# Patient Record
Sex: Female | Born: 1975
Health system: Southern US, Community
[De-identification: ages and names within clinical notes are randomized; demographics above are authoritative.]

## PROBLEM LIST (undated history)

## (undated) DIAGNOSIS — J189 Pneumonia, unspecified organism: Secondary | ICD-10-CM

## (undated) DIAGNOSIS — R112 Nausea with vomiting, unspecified: Secondary | ICD-10-CM

## (undated) DIAGNOSIS — M654 Radial styloid tenosynovitis [de Quervain]: Secondary | ICD-10-CM

## (undated) DIAGNOSIS — M1990 Unspecified osteoarthritis, unspecified site: Secondary | ICD-10-CM

## (undated) DIAGNOSIS — F419 Anxiety disorder, unspecified: Secondary | ICD-10-CM

## (undated) DIAGNOSIS — Z87898 Personal history of other specified conditions: Secondary | ICD-10-CM

## (undated) DIAGNOSIS — Z972 Presence of dental prosthetic device (complete) (partial): Secondary | ICD-10-CM

## (undated) DIAGNOSIS — Z9889 Other specified postprocedural states: Secondary | ICD-10-CM

## (undated) DIAGNOSIS — Z8489 Family history of other specified conditions: Secondary | ICD-10-CM

## (undated) DIAGNOSIS — T7840XA Allergy, unspecified, initial encounter: Secondary | ICD-10-CM

## (undated) DIAGNOSIS — G5601 Carpal tunnel syndrome, right upper limb: Secondary | ICD-10-CM

## (undated) DIAGNOSIS — M65312 Trigger thumb, left thumb: Secondary | ICD-10-CM

## (undated) DIAGNOSIS — G459 Transient cerebral ischemic attack, unspecified: Secondary | ICD-10-CM

## (undated) DIAGNOSIS — Z87442 Personal history of urinary calculi: Secondary | ICD-10-CM

## (undated) DIAGNOSIS — C801 Malignant (primary) neoplasm, unspecified: Secondary | ICD-10-CM

## (undated) HISTORY — DX: Allergy, unspecified, initial encounter: T78.40XA

## (undated) HISTORY — DX: Unspecified osteoarthritis, unspecified site: M19.90

## (undated) HISTORY — DX: Anxiety disorder, unspecified: F41.9

## (undated) HISTORY — PX: ROTATOR CUFF REPAIR: SHX139

## (undated) HISTORY — PX: FACIAL RECONSTRUCTION SURGERY: SHX631

---

## 1998-01-11 ENCOUNTER — Encounter: Admission: RE | Admit: 1998-01-11 | Discharge: 1998-01-11 | Payer: Self-pay | Admitting: Family Medicine

## 1998-02-20 ENCOUNTER — Encounter: Admission: RE | Admit: 1998-02-20 | Discharge: 1998-02-20 | Payer: Self-pay | Admitting: Family Medicine

## 1998-02-27 ENCOUNTER — Encounter: Admission: RE | Admit: 1998-02-27 | Discharge: 1998-05-28 | Payer: Self-pay | Admitting: *Deleted

## 1998-02-27 ENCOUNTER — Encounter: Admission: RE | Admit: 1998-02-27 | Discharge: 1998-02-27 | Payer: Self-pay | Admitting: Family Medicine

## 1998-02-28 ENCOUNTER — Encounter: Admission: RE | Admit: 1998-02-28 | Discharge: 1998-02-28 | Payer: Self-pay | Admitting: Sports Medicine

## 1998-04-11 ENCOUNTER — Encounter: Admission: RE | Admit: 1998-04-11 | Discharge: 1998-04-11 | Payer: Self-pay | Admitting: Family Medicine

## 1998-04-17 ENCOUNTER — Ambulatory Visit (HOSPITAL_BASED_OUTPATIENT_CLINIC_OR_DEPARTMENT_OTHER): Admission: RE | Admit: 1998-04-17 | Discharge: 1998-04-17 | Payer: Self-pay | Admitting: Orthopedic Surgery

## 1998-04-20 ENCOUNTER — Encounter: Admission: RE | Admit: 1998-04-20 | Discharge: 1998-04-20 | Payer: Self-pay | Admitting: Family Medicine

## 1998-05-03 ENCOUNTER — Encounter: Admission: RE | Admit: 1998-05-03 | Discharge: 1998-05-03 | Payer: Self-pay | Admitting: Family Medicine

## 1998-05-05 ENCOUNTER — Encounter: Admission: RE | Admit: 1998-05-05 | Discharge: 1998-05-05 | Payer: Self-pay | Admitting: Family Medicine

## 1998-05-16 ENCOUNTER — Encounter: Admission: RE | Admit: 1998-05-16 | Discharge: 1998-08-14 | Payer: Self-pay | Admitting: Orthopedic Surgery

## 1998-06-19 ENCOUNTER — Encounter: Admission: RE | Admit: 1998-06-19 | Discharge: 1998-06-19 | Payer: Self-pay | Admitting: Family Medicine

## 1998-07-10 ENCOUNTER — Encounter: Admission: RE | Admit: 1998-07-10 | Discharge: 1998-07-10 | Payer: Self-pay | Admitting: Family Medicine

## 1998-07-28 ENCOUNTER — Other Ambulatory Visit: Admission: RE | Admit: 1998-07-28 | Discharge: 1998-07-28 | Payer: Self-pay | Admitting: *Deleted

## 1998-07-28 ENCOUNTER — Encounter: Admission: RE | Admit: 1998-07-28 | Discharge: 1998-07-28 | Payer: Self-pay | Admitting: Family Medicine

## 1998-08-04 ENCOUNTER — Encounter: Admission: RE | Admit: 1998-08-04 | Discharge: 1998-08-04 | Payer: Self-pay | Admitting: Family Medicine

## 1998-08-23 ENCOUNTER — Encounter: Admission: RE | Admit: 1998-08-23 | Discharge: 1998-08-23 | Payer: Self-pay | Admitting: Family Medicine

## 1998-09-08 ENCOUNTER — Encounter: Admission: RE | Admit: 1998-09-08 | Discharge: 1998-09-08 | Payer: Self-pay | Admitting: Family Medicine

## 1998-09-21 ENCOUNTER — Encounter: Admission: RE | Admit: 1998-09-21 | Discharge: 1998-09-21 | Payer: Self-pay | Admitting: Family Medicine

## 1998-11-09 ENCOUNTER — Encounter: Admission: RE | Admit: 1998-11-09 | Discharge: 1998-11-09 | Payer: Self-pay | Admitting: Family Medicine

## 1998-11-16 ENCOUNTER — Encounter: Admission: RE | Admit: 1998-11-16 | Discharge: 1998-11-16 | Payer: Self-pay | Admitting: Family Medicine

## 1998-12-28 ENCOUNTER — Encounter: Admission: RE | Admit: 1998-12-28 | Discharge: 1998-12-28 | Payer: Self-pay | Admitting: Family Medicine

## 1999-01-11 ENCOUNTER — Encounter: Admission: RE | Admit: 1999-01-11 | Discharge: 1999-01-11 | Payer: Self-pay | Admitting: Family Medicine

## 1999-01-17 ENCOUNTER — Encounter: Admission: RE | Admit: 1999-01-17 | Discharge: 1999-01-17 | Payer: Self-pay | Admitting: Family Medicine

## 1999-01-18 ENCOUNTER — Encounter: Admission: RE | Admit: 1999-01-18 | Discharge: 1999-01-18 | Payer: Self-pay | Admitting: Family Medicine

## 1999-01-31 ENCOUNTER — Encounter: Admission: RE | Admit: 1999-01-31 | Discharge: 1999-01-31 | Payer: Self-pay | Admitting: Family Medicine

## 1999-02-14 ENCOUNTER — Encounter: Admission: RE | Admit: 1999-02-14 | Discharge: 1999-02-14 | Payer: Self-pay | Admitting: Family Medicine

## 1999-04-13 ENCOUNTER — Encounter: Admission: RE | Admit: 1999-04-13 | Discharge: 1999-04-13 | Payer: Self-pay | Admitting: Family Medicine

## 1999-04-18 ENCOUNTER — Encounter: Admission: RE | Admit: 1999-04-18 | Discharge: 1999-04-18 | Payer: Self-pay | Admitting: Family Medicine

## 1999-05-23 ENCOUNTER — Encounter: Admission: RE | Admit: 1999-05-23 | Discharge: 1999-05-23 | Payer: Self-pay | Admitting: Family Medicine

## 1999-05-31 ENCOUNTER — Encounter: Admission: RE | Admit: 1999-05-31 | Discharge: 1999-05-31 | Payer: Self-pay | Admitting: Family Medicine

## 1999-06-15 ENCOUNTER — Encounter: Admission: RE | Admit: 1999-06-15 | Discharge: 1999-06-15 | Payer: Self-pay | Admitting: Family Medicine

## 1999-06-27 ENCOUNTER — Encounter: Admission: RE | Admit: 1999-06-27 | Discharge: 1999-06-27 | Payer: Self-pay | Admitting: Family Medicine

## 1999-07-31 ENCOUNTER — Other Ambulatory Visit: Admission: RE | Admit: 1999-07-31 | Discharge: 2001-07-30 | Payer: Self-pay | Admitting: *Deleted

## 1999-07-31 ENCOUNTER — Encounter: Admission: RE | Admit: 1999-07-31 | Discharge: 1999-07-31 | Payer: Self-pay | Admitting: Sports Medicine

## 1999-10-17 ENCOUNTER — Encounter: Admission: RE | Admit: 1999-10-17 | Discharge: 1999-10-17 | Payer: Self-pay | Admitting: Family Medicine

## 1999-12-15 ENCOUNTER — Emergency Department (HOSPITAL_COMMUNITY): Admission: EM | Admit: 1999-12-15 | Discharge: 1999-12-15 | Payer: Self-pay | Admitting: Emergency Medicine

## 1999-12-17 ENCOUNTER — Emergency Department (HOSPITAL_COMMUNITY): Admission: EM | Admit: 1999-12-17 | Discharge: 1999-12-17 | Payer: Self-pay | Admitting: Emergency Medicine

## 1999-12-18 ENCOUNTER — Encounter: Admission: RE | Admit: 1999-12-18 | Discharge: 1999-12-18 | Payer: Self-pay | Admitting: Family Medicine

## 1999-12-19 ENCOUNTER — Encounter: Admission: RE | Admit: 1999-12-19 | Discharge: 1999-12-19 | Payer: Self-pay | Admitting: Family Medicine

## 2000-05-09 ENCOUNTER — Encounter: Admission: RE | Admit: 2000-05-09 | Discharge: 2000-05-09 | Payer: Self-pay | Admitting: Family Medicine

## 2000-06-26 ENCOUNTER — Encounter: Admission: RE | Admit: 2000-06-26 | Discharge: 2000-06-26 | Payer: Self-pay | Admitting: Family Medicine

## 2000-07-03 ENCOUNTER — Encounter: Admission: RE | Admit: 2000-07-03 | Discharge: 2000-07-03 | Payer: Self-pay | Admitting: Family Medicine

## 2000-07-11 ENCOUNTER — Encounter: Admission: RE | Admit: 2000-07-11 | Discharge: 2000-07-11 | Payer: Self-pay | Admitting: Family Medicine

## 2000-07-11 ENCOUNTER — Other Ambulatory Visit: Admission: RE | Admit: 2000-07-11 | Discharge: 2000-07-11 | Payer: Self-pay | Admitting: Obstetrics & Gynecology

## 2000-08-11 ENCOUNTER — Encounter: Admission: RE | Admit: 2000-08-11 | Discharge: 2000-08-11 | Payer: Self-pay | Admitting: Family Medicine

## 2000-08-25 ENCOUNTER — Encounter: Admission: RE | Admit: 2000-08-25 | Discharge: 2000-08-25 | Payer: Self-pay | Admitting: Sports Medicine

## 2000-09-23 ENCOUNTER — Encounter: Admission: RE | Admit: 2000-09-23 | Discharge: 2000-09-23 | Payer: Self-pay | Admitting: Family Medicine

## 2000-10-13 ENCOUNTER — Encounter: Admission: RE | Admit: 2000-10-13 | Discharge: 2000-10-13 | Payer: Self-pay | Admitting: Family Medicine

## 2000-10-29 ENCOUNTER — Encounter: Payer: Self-pay | Admitting: Neurology

## 2000-10-29 ENCOUNTER — Ambulatory Visit (HOSPITAL_COMMUNITY): Admission: RE | Admit: 2000-10-29 | Discharge: 2000-10-29 | Payer: Self-pay | Admitting: Neurology

## 2001-01-12 ENCOUNTER — Encounter: Admission: RE | Admit: 2001-01-12 | Discharge: 2001-01-12 | Payer: Self-pay | Admitting: Family Medicine

## 2001-01-13 ENCOUNTER — Emergency Department (HOSPITAL_COMMUNITY): Admission: EM | Admit: 2001-01-13 | Discharge: 2001-01-13 | Payer: Self-pay | Admitting: Emergency Medicine

## 2001-01-13 ENCOUNTER — Encounter: Payer: Self-pay | Admitting: Emergency Medicine

## 2001-03-13 ENCOUNTER — Encounter: Admission: RE | Admit: 2001-03-13 | Discharge: 2001-03-13 | Payer: Self-pay | Admitting: Family Medicine

## 2001-03-13 ENCOUNTER — Encounter: Admission: RE | Admit: 2001-03-13 | Discharge: 2001-03-13 | Payer: Self-pay | Admitting: *Deleted

## 2001-03-18 ENCOUNTER — Encounter: Admission: RE | Admit: 2001-03-18 | Discharge: 2001-03-18 | Payer: Self-pay | Admitting: Family Medicine

## 2001-05-27 ENCOUNTER — Emergency Department (HOSPITAL_COMMUNITY): Admission: EM | Admit: 2001-05-27 | Discharge: 2001-05-28 | Payer: Self-pay | Admitting: Emergency Medicine

## 2001-06-09 ENCOUNTER — Encounter: Admission: RE | Admit: 2001-06-09 | Discharge: 2001-06-09 | Payer: Self-pay | Admitting: Sports Medicine

## 2001-08-25 ENCOUNTER — Encounter: Admission: RE | Admit: 2001-08-25 | Discharge: 2001-08-25 | Payer: Self-pay | Admitting: Family Medicine

## 2001-09-24 ENCOUNTER — Other Ambulatory Visit: Admission: RE | Admit: 2001-09-24 | Discharge: 2001-09-24 | Payer: Self-pay | Admitting: Sports Medicine

## 2001-09-24 ENCOUNTER — Encounter: Admission: RE | Admit: 2001-09-24 | Discharge: 2001-09-24 | Payer: Self-pay | Admitting: Family Medicine

## 2001-10-19 ENCOUNTER — Encounter: Admission: RE | Admit: 2001-10-19 | Discharge: 2001-10-19 | Payer: Self-pay | Admitting: Sports Medicine

## 2001-10-26 ENCOUNTER — Encounter: Admission: RE | Admit: 2001-10-26 | Discharge: 2001-10-26 | Payer: Self-pay | Admitting: Family Medicine

## 2001-11-11 ENCOUNTER — Encounter: Admission: RE | Admit: 2001-11-11 | Discharge: 2001-11-11 | Payer: Self-pay | Admitting: Family Medicine

## 2001-12-10 ENCOUNTER — Encounter: Admission: RE | Admit: 2001-12-10 | Discharge: 2001-12-10 | Payer: Self-pay | Admitting: Family Medicine

## 2001-12-16 ENCOUNTER — Encounter: Admission: RE | Admit: 2001-12-16 | Discharge: 2001-12-16 | Payer: Self-pay | Admitting: Psychology

## 2001-12-24 ENCOUNTER — Encounter: Admission: RE | Admit: 2001-12-24 | Discharge: 2001-12-24 | Payer: Self-pay | Admitting: Family Medicine

## 2002-01-13 ENCOUNTER — Encounter: Admission: RE | Admit: 2002-01-13 | Discharge: 2002-01-13 | Payer: Self-pay | Admitting: Family Medicine

## 2002-02-03 ENCOUNTER — Emergency Department (HOSPITAL_COMMUNITY): Admission: EM | Admit: 2002-02-03 | Discharge: 2002-02-04 | Payer: Self-pay | Admitting: Emergency Medicine

## 2002-02-03 ENCOUNTER — Encounter: Admission: RE | Admit: 2002-02-03 | Discharge: 2002-02-03 | Payer: Self-pay | Admitting: Family Medicine

## 2002-02-08 ENCOUNTER — Inpatient Hospital Stay (HOSPITAL_COMMUNITY): Admission: AD | Admit: 2002-02-08 | Discharge: 2002-02-09 | Payer: Self-pay | Admitting: Family Medicine

## 2002-02-08 ENCOUNTER — Encounter: Admission: RE | Admit: 2002-02-08 | Discharge: 2002-02-08 | Payer: Self-pay | Admitting: Sports Medicine

## 2002-02-08 ENCOUNTER — Encounter: Payer: Self-pay | Admitting: Family Medicine

## 2002-02-15 ENCOUNTER — Encounter: Payer: Self-pay | Admitting: Sports Medicine

## 2002-02-15 ENCOUNTER — Encounter: Admission: RE | Admit: 2002-02-15 | Discharge: 2002-02-15 | Payer: Self-pay | Admitting: Family Medicine

## 2002-02-15 ENCOUNTER — Encounter: Admission: RE | Admit: 2002-02-15 | Discharge: 2002-02-15 | Payer: Self-pay | Admitting: Sports Medicine

## 2002-02-19 ENCOUNTER — Encounter: Admission: RE | Admit: 2002-02-19 | Discharge: 2002-02-19 | Payer: Self-pay | Admitting: Family Medicine

## 2002-02-25 ENCOUNTER — Encounter: Admission: RE | Admit: 2002-02-25 | Discharge: 2002-02-25 | Payer: Self-pay | Admitting: Family Medicine

## 2002-03-02 ENCOUNTER — Encounter: Admission: RE | Admit: 2002-03-02 | Discharge: 2002-03-02 | Payer: Self-pay | Admitting: Family Medicine

## 2002-04-14 ENCOUNTER — Encounter: Admission: RE | Admit: 2002-04-14 | Discharge: 2002-04-14 | Payer: Self-pay | Admitting: Sports Medicine

## 2002-05-14 ENCOUNTER — Encounter: Admission: RE | Admit: 2002-05-14 | Discharge: 2002-05-14 | Payer: Self-pay | Admitting: Podiatry

## 2002-07-08 ENCOUNTER — Encounter: Admission: RE | Admit: 2002-07-08 | Discharge: 2002-07-08 | Payer: Self-pay | Admitting: Family Medicine

## 2002-09-24 ENCOUNTER — Encounter: Admission: RE | Admit: 2002-09-24 | Discharge: 2002-09-24 | Payer: Self-pay | Admitting: Family Medicine

## 2002-09-24 ENCOUNTER — Other Ambulatory Visit: Admission: RE | Admit: 2002-09-24 | Discharge: 2002-09-24 | Payer: Self-pay | Admitting: Family Medicine

## 2002-12-12 ENCOUNTER — Emergency Department (HOSPITAL_COMMUNITY): Admission: EM | Admit: 2002-12-12 | Discharge: 2002-12-12 | Payer: Self-pay | Admitting: Emergency Medicine

## 2002-12-12 ENCOUNTER — Encounter: Payer: Self-pay | Admitting: Emergency Medicine

## 2003-01-05 ENCOUNTER — Encounter: Payer: Self-pay | Admitting: Orthopedic Surgery

## 2003-01-05 ENCOUNTER — Ambulatory Visit (HOSPITAL_COMMUNITY): Admission: RE | Admit: 2003-01-05 | Discharge: 2003-01-05 | Payer: Self-pay | Admitting: Orthopedic Surgery

## 2003-01-26 ENCOUNTER — Encounter: Admission: RE | Admit: 2003-01-26 | Discharge: 2003-02-25 | Payer: Self-pay | Admitting: Orthopedic Surgery

## 2003-04-13 ENCOUNTER — Emergency Department (HOSPITAL_COMMUNITY): Admission: EM | Admit: 2003-04-13 | Discharge: 2003-04-13 | Payer: Self-pay | Admitting: Emergency Medicine

## 2003-04-14 ENCOUNTER — Encounter: Admission: RE | Admit: 2003-04-14 | Discharge: 2003-04-14 | Payer: Self-pay | Admitting: Family Medicine

## 2003-04-19 ENCOUNTER — Encounter: Admission: RE | Admit: 2003-04-19 | Discharge: 2003-04-19 | Payer: Self-pay | Admitting: Family Medicine

## 2003-05-03 ENCOUNTER — Encounter: Admission: RE | Admit: 2003-05-03 | Discharge: 2003-05-03 | Payer: Self-pay | Admitting: Sports Medicine

## 2003-05-06 ENCOUNTER — Encounter: Payer: Self-pay | Admitting: Family Medicine

## 2003-05-06 ENCOUNTER — Ambulatory Visit (HOSPITAL_COMMUNITY): Admission: RE | Admit: 2003-05-06 | Discharge: 2003-05-06 | Payer: Self-pay | Admitting: Family Medicine

## 2003-05-31 ENCOUNTER — Encounter: Admission: RE | Admit: 2003-05-31 | Discharge: 2003-05-31 | Payer: Self-pay | Admitting: Sports Medicine

## 2003-06-14 ENCOUNTER — Encounter: Admission: RE | Admit: 2003-06-14 | Discharge: 2003-06-14 | Payer: Self-pay | Admitting: Family Medicine

## 2003-06-14 ENCOUNTER — Other Ambulatory Visit: Admission: RE | Admit: 2003-06-14 | Discharge: 2003-06-14 | Payer: Self-pay | Admitting: Family Medicine

## 2003-07-04 ENCOUNTER — Encounter: Admission: RE | Admit: 2003-07-04 | Discharge: 2003-07-04 | Payer: Self-pay | Admitting: Family Medicine

## 2003-07-08 ENCOUNTER — Ambulatory Visit (HOSPITAL_COMMUNITY): Admission: RE | Admit: 2003-07-08 | Discharge: 2003-07-08 | Payer: Self-pay | Admitting: Family Medicine

## 2003-07-11 ENCOUNTER — Inpatient Hospital Stay (HOSPITAL_COMMUNITY): Admission: AD | Admit: 2003-07-11 | Discharge: 2003-07-11 | Payer: Self-pay | Admitting: Obstetrics & Gynecology

## 2003-08-01 ENCOUNTER — Encounter: Admission: RE | Admit: 2003-08-01 | Discharge: 2003-08-01 | Payer: Self-pay | Admitting: Family Medicine

## 2003-08-18 ENCOUNTER — Encounter: Admission: RE | Admit: 2003-08-18 | Discharge: 2003-08-18 | Payer: Self-pay | Admitting: Sports Medicine

## 2003-08-25 ENCOUNTER — Encounter: Admission: RE | Admit: 2003-08-25 | Discharge: 2003-08-25 | Payer: Self-pay | Admitting: Family Medicine

## 2003-08-29 ENCOUNTER — Encounter: Admission: RE | Admit: 2003-08-29 | Discharge: 2003-08-29 | Payer: Self-pay | Admitting: Family Medicine

## 2003-09-26 ENCOUNTER — Encounter: Admission: RE | Admit: 2003-09-26 | Discharge: 2003-09-26 | Payer: Self-pay | Admitting: Family Medicine

## 2003-10-04 ENCOUNTER — Inpatient Hospital Stay (HOSPITAL_COMMUNITY): Admission: AD | Admit: 2003-10-04 | Discharge: 2003-10-04 | Payer: Self-pay | Admitting: *Deleted

## 2003-10-10 ENCOUNTER — Encounter: Admission: RE | Admit: 2003-10-10 | Discharge: 2003-10-10 | Payer: Self-pay | Admitting: Family Medicine

## 2003-10-25 ENCOUNTER — Encounter: Admission: RE | Admit: 2003-10-25 | Discharge: 2003-10-25 | Payer: Self-pay | Admitting: Family Medicine

## 2003-11-09 ENCOUNTER — Encounter: Admission: RE | Admit: 2003-11-09 | Discharge: 2003-11-09 | Payer: Self-pay | Admitting: Family Medicine

## 2003-11-25 ENCOUNTER — Encounter: Admission: RE | Admit: 2003-11-25 | Discharge: 2003-11-25 | Payer: Self-pay | Admitting: Family Medicine

## 2003-11-29 ENCOUNTER — Encounter: Admission: RE | Admit: 2003-11-29 | Discharge: 2003-11-29 | Payer: Self-pay | Admitting: Family Medicine

## 2003-12-06 ENCOUNTER — Inpatient Hospital Stay (HOSPITAL_COMMUNITY): Admission: AD | Admit: 2003-12-06 | Discharge: 2003-12-09 | Payer: Self-pay | Admitting: *Deleted

## 2003-12-07 ENCOUNTER — Encounter (INDEPENDENT_AMBULATORY_CARE_PROVIDER_SITE_OTHER): Payer: Self-pay | Admitting: *Deleted

## 2003-12-07 HISTORY — PX: TUBAL LIGATION: SHX77

## 2003-12-12 ENCOUNTER — Encounter: Admission: RE | Admit: 2003-12-12 | Discharge: 2003-12-12 | Payer: Self-pay | Admitting: Family Medicine

## 2004-01-27 ENCOUNTER — Encounter: Admission: RE | Admit: 2004-01-27 | Discharge: 2004-01-27 | Payer: Self-pay | Admitting: Family Medicine

## 2004-02-24 ENCOUNTER — Encounter: Admission: RE | Admit: 2004-02-24 | Discharge: 2004-02-24 | Payer: Self-pay | Admitting: Sports Medicine

## 2004-06-12 ENCOUNTER — Ambulatory Visit: Payer: Self-pay | Admitting: Family Medicine

## 2004-08-28 ENCOUNTER — Ambulatory Visit: Payer: Self-pay | Admitting: Family Medicine

## 2005-01-28 ENCOUNTER — Ambulatory Visit: Payer: Self-pay | Admitting: Family Medicine

## 2005-01-29 ENCOUNTER — Emergency Department (HOSPITAL_COMMUNITY): Admission: EM | Admit: 2005-01-29 | Discharge: 2005-01-29 | Payer: Self-pay | Admitting: Emergency Medicine

## 2005-01-29 ENCOUNTER — Ambulatory Visit: Payer: Self-pay | Admitting: Sports Medicine

## 2005-03-22 ENCOUNTER — Emergency Department (HOSPITAL_COMMUNITY): Admission: EM | Admit: 2005-03-22 | Discharge: 2005-03-22 | Payer: Self-pay | Admitting: Family Medicine

## 2005-03-27 ENCOUNTER — Ambulatory Visit: Payer: Self-pay | Admitting: Family Medicine

## 2005-04-10 ENCOUNTER — Ambulatory Visit: Payer: Self-pay | Admitting: Sports Medicine

## 2005-04-25 ENCOUNTER — Encounter: Admission: RE | Admit: 2005-04-25 | Discharge: 2005-07-22 | Payer: Self-pay | Admitting: Sports Medicine

## 2005-05-10 ENCOUNTER — Ambulatory Visit: Payer: Self-pay | Admitting: Sports Medicine

## 2005-05-15 ENCOUNTER — Encounter: Admission: RE | Admit: 2005-05-15 | Discharge: 2005-05-15 | Payer: Self-pay | Admitting: Sports Medicine

## 2005-06-04 ENCOUNTER — Ambulatory Visit: Payer: Self-pay | Admitting: Sports Medicine

## 2005-06-05 ENCOUNTER — Ambulatory Visit: Payer: Self-pay | Admitting: Sports Medicine

## 2005-06-19 ENCOUNTER — Ambulatory Visit: Payer: Self-pay | Admitting: Family Medicine

## 2005-07-03 ENCOUNTER — Ambulatory Visit: Payer: Self-pay | Admitting: Sports Medicine

## 2005-07-17 ENCOUNTER — Ambulatory Visit: Payer: Self-pay | Admitting: Sports Medicine

## 2005-08-07 ENCOUNTER — Encounter (INDEPENDENT_AMBULATORY_CARE_PROVIDER_SITE_OTHER): Payer: Self-pay | Admitting: *Deleted

## 2005-08-22 ENCOUNTER — Ambulatory Visit: Payer: Self-pay | Admitting: Sports Medicine

## 2005-08-22 ENCOUNTER — Other Ambulatory Visit: Admission: RE | Admit: 2005-08-22 | Discharge: 2005-08-22 | Payer: Self-pay | Admitting: Family Medicine

## 2005-09-02 ENCOUNTER — Ambulatory Visit: Payer: Self-pay | Admitting: Family Medicine

## 2005-09-10 ENCOUNTER — Ambulatory Visit (HOSPITAL_COMMUNITY): Admission: RE | Admit: 2005-09-10 | Discharge: 2005-09-10 | Payer: Self-pay | Admitting: Neurosurgery

## 2005-09-10 HISTORY — PX: ULNAR NERVE REPAIR: SHX2594

## 2006-09-02 ENCOUNTER — Ambulatory Visit: Payer: Self-pay | Admitting: Family Medicine

## 2006-09-17 ENCOUNTER — Ambulatory Visit: Payer: Self-pay | Admitting: Sports Medicine

## 2006-09-22 ENCOUNTER — Ambulatory Visit: Payer: Self-pay | Admitting: Sports Medicine

## 2006-10-07 ENCOUNTER — Encounter (INDEPENDENT_AMBULATORY_CARE_PROVIDER_SITE_OTHER): Payer: Self-pay | Admitting: Specialist

## 2006-10-07 ENCOUNTER — Inpatient Hospital Stay (HOSPITAL_COMMUNITY): Admission: EM | Admit: 2006-10-07 | Discharge: 2006-10-08 | Payer: Self-pay | Admitting: Family Medicine

## 2006-10-07 HISTORY — PX: APPENDECTOMY: SHX54

## 2006-12-02 ENCOUNTER — Ambulatory Visit: Payer: Self-pay | Admitting: Family Medicine

## 2006-12-04 DIAGNOSIS — Z87898 Personal history of other specified conditions: Secondary | ICD-10-CM | POA: Insufficient documentation

## 2006-12-04 DIAGNOSIS — J45901 Unspecified asthma with (acute) exacerbation: Secondary | ICD-10-CM | POA: Insufficient documentation

## 2006-12-04 DIAGNOSIS — F419 Anxiety disorder, unspecified: Secondary | ICD-10-CM | POA: Insufficient documentation

## 2006-12-04 DIAGNOSIS — J45909 Unspecified asthma, uncomplicated: Secondary | ICD-10-CM | POA: Insufficient documentation

## 2006-12-04 HISTORY — DX: Unspecified asthma with (acute) exacerbation: J45.901

## 2006-12-05 ENCOUNTER — Encounter (INDEPENDENT_AMBULATORY_CARE_PROVIDER_SITE_OTHER): Payer: Self-pay | Admitting: *Deleted

## 2007-02-04 ENCOUNTER — Telehealth: Payer: Self-pay | Admitting: *Deleted

## 2007-08-13 ENCOUNTER — Encounter (INDEPENDENT_AMBULATORY_CARE_PROVIDER_SITE_OTHER): Payer: Self-pay | Admitting: *Deleted

## 2007-10-08 DIAGNOSIS — G459 Transient cerebral ischemic attack, unspecified: Secondary | ICD-10-CM

## 2007-10-08 HISTORY — DX: Transient cerebral ischemic attack, unspecified: G45.9

## 2007-10-30 ENCOUNTER — Telehealth (INDEPENDENT_AMBULATORY_CARE_PROVIDER_SITE_OTHER): Payer: Self-pay | Admitting: *Deleted

## 2007-12-03 ENCOUNTER — Telehealth (INDEPENDENT_AMBULATORY_CARE_PROVIDER_SITE_OTHER): Payer: Self-pay | Admitting: Family Medicine

## 2008-01-07 ENCOUNTER — Telehealth (INDEPENDENT_AMBULATORY_CARE_PROVIDER_SITE_OTHER): Payer: Self-pay | Admitting: *Deleted

## 2008-09-26 ENCOUNTER — Telehealth: Payer: Self-pay | Admitting: *Deleted

## 2008-09-26 ENCOUNTER — Ambulatory Visit: Payer: Self-pay | Admitting: Family Medicine

## 2008-09-27 ENCOUNTER — Telehealth: Payer: Self-pay | Admitting: Family Medicine

## 2009-02-13 ENCOUNTER — Ambulatory Visit: Payer: Self-pay | Admitting: Family Medicine

## 2009-02-13 ENCOUNTER — Inpatient Hospital Stay (HOSPITAL_COMMUNITY): Admission: AD | Admit: 2009-02-13 | Discharge: 2009-02-14 | Payer: Self-pay | Admitting: Family Medicine

## 2009-02-13 DIAGNOSIS — R209 Unspecified disturbances of skin sensation: Secondary | ICD-10-CM | POA: Insufficient documentation

## 2009-02-14 ENCOUNTER — Encounter: Payer: Self-pay | Admitting: Family Medicine

## 2009-02-16 ENCOUNTER — Encounter (INDEPENDENT_AMBULATORY_CARE_PROVIDER_SITE_OTHER): Payer: Self-pay | Admitting: Family Medicine

## 2009-02-16 ENCOUNTER — Ambulatory Visit: Payer: Self-pay | Admitting: Family Medicine

## 2009-02-27 ENCOUNTER — Encounter (INDEPENDENT_AMBULATORY_CARE_PROVIDER_SITE_OTHER): Payer: Self-pay | Admitting: Family Medicine

## 2009-03-13 ENCOUNTER — Telehealth (INDEPENDENT_AMBULATORY_CARE_PROVIDER_SITE_OTHER): Payer: Self-pay | Admitting: Family Medicine

## 2009-11-06 ENCOUNTER — Telehealth: Payer: Self-pay | Admitting: Family Medicine

## 2009-11-08 ENCOUNTER — Encounter (INDEPENDENT_AMBULATORY_CARE_PROVIDER_SITE_OTHER): Payer: Self-pay | Admitting: *Deleted

## 2009-12-18 ENCOUNTER — Telehealth: Payer: Self-pay | Admitting: Family Medicine

## 2010-02-01 ENCOUNTER — Encounter: Admission: RE | Admit: 2010-02-01 | Discharge: 2010-02-01 | Payer: Self-pay | Admitting: Family Medicine

## 2010-02-01 ENCOUNTER — Ambulatory Visit: Payer: Self-pay | Admitting: Family Medicine

## 2010-02-01 DIAGNOSIS — M79609 Pain in unspecified limb: Secondary | ICD-10-CM | POA: Insufficient documentation

## 2010-08-26 ENCOUNTER — Encounter: Payer: Self-pay | Admitting: Family Medicine

## 2010-09-27 ENCOUNTER — Telehealth: Payer: Self-pay | Admitting: Sports Medicine

## 2010-11-06 NOTE — Progress Notes (Signed)
Summary: phn msg  Phone Note Call from Patient   Caller: Patient Summary of Call: Pt could not come today due to not having insurance.  Company just told her on Friday she was not eligible. Initial call taken by: Clydell Hakim,  December 18, 2009 1:42 PM  Follow-up for Phone Call        to PCP Follow-up by: Gladstone Pih,  December 18, 2009 2:08 PM  Additional Follow-up for Phone Call Additional follow up Details #1::        Will f/u on as needed basis Additional Follow-up by: Marisue Ivan  MD,  December 18, 2009 2:46 PM

## 2010-11-06 NOTE — Letter (Signed)
Summary: Generic Letter  [facility]  [address]   [place], [place] [address]   Phone: [phone]  Fax: [phone]    11/08/2009  Shenelle Leveille [address] [place], [place]  [address]  Dear Ms. Jarrard,   Dr Burnadette Pop has called in a refill of your Ventolin, however he will not be able to refill this again until you make an appointment in the office to see him.  He will need to evauluate you since he has not seen you before.    Please call the office at [phone] to schedule an appointment with Dr Burnadette Pop.   Sincerely,   Gladstone Pih LPN

## 2010-11-06 NOTE — Progress Notes (Signed)
Summary: Ventolin HFA prn #1 x0 - pt needs f/u for further refills  Phone Note Refill Request Call back at [phone] Message from:  Patient  Refills Requested: Medication #1:  VENTOLIN HFA 108 (90 BASE) MCG/ACT AERS 2 puffs qid as needed for cough [facility] [address]  Initial call taken by: Clydell Hakim,  November 06, 2009 4:50 PM  Follow-up for Phone Call        will forward to MD. Follow-up by: Theresia Lo RN,  November 07, 2009 10:00 AM    Prescriptions: VENTOLIN HFA 108 (90 BASE) MCG/ACT AERS (ALBUTEROL SULFATE) 2 puffs qid as needed for cough  #1 x 0   Entered and Authorized by:   Marisue Ivan  MD   Signed by:   Marisue Ivan  MD on 11/07/2009   Method used:   [facility]  [address] [phone][facility] (retail)       [address]       [place], [place]  [address]       Ph: [phone]       Fax: [phone]   RxID:   [id number]

## 2010-11-06 NOTE — Miscellaneous (Signed)
  Clinical Lists Changes  Problems: Changed problem from RHINITIS, ALLERGIC (ICD-477.9) to ASTHMA, PERSISTENT (ICD-493.90)

## 2010-11-06 NOTE — Assessment & Plan Note (Signed)
Summary: Left hand pain s/p fall   Vital Signs:  Patient profile:   35 year old female Height:      62 inches Weight:      176.6 pounds BMI:     32.42 Temp:     98.7 degrees F oral Pulse rate:   88 / minute BP sitting:   119 / 82  (left arm) Cuff size:   regular  Vitals Entered By: Gladstone Pih (February 01, 2010 4:48 PM) CC: C/O left hand pain Is Patient Diabetic? No Pain Assessment Patient in pain? yes     Location: hand Intensity: 6 Type: aching   Primary Care Provider:  Marisue Ivan  MD  CC:  C/O left hand pain.  History of Present Illness: 35yo F s/p fall  Left hand pain: s/p fall this afternoon on left hand.  Pain localized to ulnar aspect of the hand.  States swelling, brusing, and pain with touch and unable to make a fist.  No prior hx of fracture but does report a hx of ulnar nerve surgery therefore she has less sensation on that side of the hand.  Denies any head trauma.  Current Medications (verified): 1)  Ventolin Hfa 108 (90 Base) Mcg/act Aers (Albuterol Sulfate) .... 2 Puffs Qid As Needed For Cough 2)  Claritin 10 Mg Tabs (Loratadine) .... Otc  Allergies (verified): 1)  ! Codeine  Review of Systems      See HPI  Physical Exam  General:  VS Reviewed. Well appearing, NAD.  Head:  atraumatic.   Extremities:  Left hand Inspection- mild edema and earyly ecchymosis of ulnar aspec ROM- limited flexion at MCP joint; full flexion and extension of the wrist Palpation- ttp of 5th metacarpal; no ttp of the wrist Neurologic:  chronic dec sensation of distal 5th digit   Impression & Recommendations:  Problem # 1:  HAND PAIN, LEFT (ICD-729.5) Assessment New s/p fall today. Concern for possible fracture; no signs of dislocation on exam plan to place in ulnar gutter splint and send for xray will treat conservatively with rice, elevation, and nsaids plan to call pt with xray report.  Orders: Radiology other (Radiology Other) [facility]- Est Level  3  ([address])  Complete Medication List: 1)  Ventolin Hfa 108 (90 Base) Mcg/act Aers (Albuterol sulfate) .... 2 puffs qid as needed for cough 2)  Claritin 10 Mg Tabs (Loratadine) .... Otc

## 2010-11-08 NOTE — Progress Notes (Signed)
Summary: EMERGENCY LINE CALL  Phone Note Call from Patient Call back at Home Phone [phone]   Caller: Patient Summary of Call: 35 yo female with hx asthma, usually takes sleep aids (diphenhydramine 50mg ) but accidentally took two tabs.  She feels ok but just a little dizzy on standing.  No SOB, no CP.  Feels very sleepy.  No problems voiding.  I advised that 100mg  is the max single dose, she would feel very sleepy but should be fine.  She was very Adult nurse. Initial call taken by: Rodney Langton MD,  September 27, 2010 10:03 PM

## 2010-11-16 ENCOUNTER — Encounter: Payer: Self-pay | Admitting: *Deleted

## 2010-12-13 ENCOUNTER — Encounter: Payer: Self-pay | Admitting: Family Medicine

## 2010-12-27 ENCOUNTER — Encounter: Payer: Self-pay | Admitting: Family Medicine

## 2011-01-15 LAB — COMPREHENSIVE METABOLIC PANEL
AST: 16 U/L (ref 0–37)
BUN: 7 mg/dL (ref 6–23)
CO2: 24 mEq/L (ref 19–32)
Calcium: 8.6 mg/dL (ref 8.4–10.5)
Chloride: 108 mEq/L (ref 96–112)
Creatinine, Ser: 0.58 mg/dL (ref 0.4–1.2)
GFR calc non Af Amer: >60 mL/min (ref >60)
Glucose, Bld: 92 mg/dL (ref 70–99)
Total Bilirubin: 0.3 mg/dL (ref 0.3–1.2)

## 2011-01-15 LAB — SEDIMENTATION RATE: Sed Rate: 9 mm/hr (ref 0–22)

## 2011-01-15 LAB — PROTIME-INR
INR: 1 (ref 0.00–1.49)
Prothrombin Time: 13.6 seconds (ref 11.6–15.2)

## 2011-01-15 LAB — CBC
HCT: 39.2 % (ref 36.0–46.0)
Hemoglobin: 13.4 g/dL (ref 12.0–15.0)
MCHC: 34.2 g/dL (ref 30.0–36.0)
MCV: 90.7 fL (ref 78.0–100.0)
RBC: 4.32 MIL/uL (ref 3.87–5.11)
WBC: 9.3 10*3/uL (ref 4.0–10.5)

## 2011-01-15 LAB — LIPID PANEL
LDL Cholesterol: 82 mg/dL (ref 0–99)
Total CHOL/HDL Ratio: 4.1 RATIO
VLDL: 13 mg/dL (ref 0–40)

## 2011-01-21 ENCOUNTER — Other Ambulatory Visit (HOSPITAL_COMMUNITY)
Admission: RE | Admit: 2011-01-21 | Discharge: 2011-01-21 | Disposition: A | Discharge status: Home / Self Care | Payer: BLUE CROSS/BLUE SHIELD | Source: Ambulatory Visit | Attending: Family Medicine | Admitting: Family Medicine

## 2011-01-21 ENCOUNTER — Ambulatory Visit (INDEPENDENT_AMBULATORY_CARE_PROVIDER_SITE_OTHER): Payer: BLUE CROSS/BLUE SHIELD | Admitting: Family Medicine

## 2011-01-21 ENCOUNTER — Encounter: Payer: Self-pay | Admitting: Family Medicine

## 2011-01-21 VITALS — BP 128/81 | HR 90 | Temp 98.7°F | Ht 62.0 in | Wt 179.0 lb

## 2011-01-21 DIAGNOSIS — Z01419 Encounter for gynecological examination (general) (routine) without abnormal findings: Secondary | ICD-10-CM | POA: Insufficient documentation

## 2011-01-21 DIAGNOSIS — J45909 Unspecified asthma, uncomplicated: Secondary | ICD-10-CM

## 2011-01-21 DIAGNOSIS — J309 Allergic rhinitis, unspecified: Secondary | ICD-10-CM

## 2011-01-21 DIAGNOSIS — Z124 Encounter for screening for malignant neoplasm of cervix: Secondary | ICD-10-CM

## 2011-01-21 DIAGNOSIS — G5600 Carpal tunnel syndrome, unspecified upper limb: Secondary | ICD-10-CM

## 2011-01-21 LAB — BASIC METABOLIC PANEL
Chloride: 107 mEq/L (ref 96–112)
Creat: 0.58 mg/dL (ref 0.40–1.20)
Sodium: 139 mEq/L (ref 135–145)

## 2011-01-21 LAB — CBC
HCT: 40.9 % (ref 36.0–46.0)
Hemoglobin: 13.9 g/dL (ref 12.0–15.0)
MCH: 30.8 pg (ref 26.0–34.0)
RBC: 4.52 MIL/uL (ref 3.87–5.11)

## 2011-01-21 NOTE — Patient Instructions (Signed)
It was good to see you again today. Try the wrist splints at night, every night. Go to a Medical Supply store and ask about the wrist splints You can also try wearing them while you drive.  For your allergies, use the eye drops and nasal sprays daily. I will refill your Albuterol.

## 2011-01-22 ENCOUNTER — Encounter: Payer: Self-pay | Admitting: Family Medicine

## 2011-01-22 ENCOUNTER — Telehealth: Payer: Self-pay | Admitting: Family Medicine

## 2011-01-22 DIAGNOSIS — J329 Chronic sinusitis, unspecified: Secondary | ICD-10-CM | POA: Insufficient documentation

## 2011-01-22 DIAGNOSIS — Z9889 Other specified postprocedural states: Secondary | ICD-10-CM

## 2011-01-22 DIAGNOSIS — G5600 Carpal tunnel syndrome, unspecified upper limb: Secondary | ICD-10-CM | POA: Insufficient documentation

## 2011-01-22 HISTORY — DX: Other specified postprocedural states: Z98.890

## 2011-01-22 MED ORDER — OLOPATADINE HCL 0.1 % OP SOLN: 1.0000 [drp] | Freq: Two times a day (BID) | OPHTHALMIC | Status: DC

## 2011-01-22 MED ORDER — FLUTICASONE PROPIONATE 50 MCG/ACT NA SUSP: 1.0000 | Freq: Every day | NASAL | Status: DC
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
## 2011-01-22 NOTE — Telephone Encounter (Signed)
Called to find out which pharmacy to send medications to. Left message at home. Called work, but they stated she does not work at that [employer].

## 2011-01-22 NOTE — Assessment & Plan Note (Signed)
Conservative treatment for now.  Patient concerned b/c mother with Carpal tunnel, surgery failed to improve symptoms.  Plan for wrist splints --> wrote paper script for these as I could not figure out how to do this in Epic.

## 2011-01-22 NOTE — Assessment & Plan Note (Addendum)
Patient would benefit from controller medication.   However insurance considers this a "pre-existing condition" and will not cover for first year.   Cannot recommend a medication from another country.   Hopeful that treating allergies with Olapatine and intranasal steroids can help with asthma symptoms.   Singulair is another option, will try steroids/Olapatiine first.

## 2011-01-22 NOTE — Assessment & Plan Note (Addendum)
Likely combined seasonal with allergies to indoor pollens as allergy symptoms are present throughout year but worse with season change/pollen. Will continue Claritin.  Add intrasal steroid and Olapatine eye drops, hopeful this may somewhat improve asthma as well.   Also this should decrease OTC ocular anti-histamine, concern for rebound conjunctivitis with med overuse.

## 2011-01-22 NOTE — Telephone Encounter (Signed)
Patient returned call at number from which I called. [name]. Discussed normal labs with her.

## 2011-01-23 ENCOUNTER — Encounter: Payer: Self-pay | Admitting: Family Medicine

## 2011-02-19 NOTE — Discharge Summary (Signed)
[name], [name]              ACCOUNT NO.:  [id number]   MEDICAL RECORD NO.:  [id number]          PATIENT TYPE:  INP   LOCATION:  3009                         FACILITY:  [facility]   PHYSICIAN:  [name], M.D.DATE OF BIRTH:  11/20/1975   DATE OF ADMISSION:  02/13/2009  DATE OF DISCHARGE:  02/14/2009                               DISCHARGE SUMMARY   DISCHARGE DIAGNOSIS:  Transient facial weakness.   DISCHARGE MEDICATIONS:  1. Aspirin 325 mg p.o. daily.  2. Albuterol 2 puffs q.4 h. p.r.n. wheezing.   REASON FOR HOSPITALIZATION:  The patient is a 35 year old female with a  past medical history of asthma, who presented to clinic after 2 discrete  episodes of one-sided facial drooping, one of which was witnessed by her  husband.  They resolved spontaneously, and there was no associated  headaches, so she was seen in clinic and then admitted to the hospital  for a workup for concern for a TIA.   LABORATORY DATA AND STUDIES:  Fasting lipid panel, cholesterol was 126,  triglycerides 63, HDL 31, LDL 82.  TSH was normal at 1.477.  ESR was  normal at 9.  A CMET was normal with the exception of potassium of 3.4  and alkaline phosphatase of 119, and an albumin of 3.4, only very minor  abnormalities.  PTT was normal at 27, PT normal at 13.6, INR normal at  1.0, and a CBC was completely within normal limits with a white blood  cell count of 9.3, hemoglobin of 13.4, platelets of 213.   MRI/MRA head was done that showed normal-sized ventricles, no infarct or  mass, normal brain stem and white matter, no hemorrhage or fluid, and  clear paranasal sinuses.  There is also note in the report that the  internal carotid arteries are patent bilaterally with no stenosis.  Anterior and middle cerebral arteries were also noted to be patent  bilaterally.  There were no cerebral aneurysms identified, and MRI/MRA  showed no abnormal findings.  An echocardiogram was also done, which  showed an EF in  the range of 55-60% with normal wall motion.  The body  of the report said there was no cardiac source of embolism identified;  however, this was a transthoracic echo and not the most effective study  to rule out an embolism.  Recommendations were to consider TTE if this  is clinically warranted.  For full report, please see Dr. Verl Butler  dictation and E-chart.   HOSPITAL COURSE BY PROBLEM:  1. Facial droop, facial weakness:  The workup done in the hospital was      essentially negative with normal labs and a normal 2-D echo.  On      the patient's admission, Dr. Deirdre Butler ordered a bubble study; however,      once the patient was admitted, we found out that a bubble study      must be ordered by Cardiology or there will have to be an order to      be ordered by [facility] and we would have to have an abnormal      finding  on her 2-D echo indicating the need for a bubble study;      therefore, we deferred the bubble study while the patient was in-      house.  We went ahead and discharged her given her normal workup      and made her a followup appointment within a week with Dr. Deirdre Butler so      that they can decide whether a bubble study is warranted in her      case.  Although we did not come up with a definitive diagnosis for      her and reason for her facial drooping, facial weakness, it seems      the most likely reason would be a complicated migraine although the      patient has not had a migraine in many years and she did not have a      headache that accompanied these symptoms.  So, we will leave      further workup to be between [name] and her PCP, but during      her hospitalization, she was started on an aspirin full dose 325      mg.  Again, the need to continue that can be discussed between Ms.      Butler and Dr. Deirdre Butler.  2. Asthma:  The patient will continue on her p.r.n. albuterol.  She      did not have any problems with this while she was hospitalized.    FOLLOWUP APPOINTMENT:  The patient has a followup appointment with Dr.  Deirdre Butler at [facility] on Monday, Feb 20, 2009, at 8:30  a.m.  She has been made aware of this appointment.   FOLLOWUP ISSUES:  Essentially include deciding on any further workup  that is needed for her facial numbness, such as, a bubble study or a  Neurology consult and this can be negotiated between Dr. Deirdre Butler and Ms.  Butler.      Amber Muir, MD  Electronically Signed      [name], M.D.  Electronically Signed    SO/MEDQ  D:  02/15/2009  T:  02/16/2009  Job:  [id number]

## 2011-02-22 NOTE — Op Note (Signed)
[name], [name]              ACCOUNT NO.:  [id number]   MEDICAL RECORD NO.:  [id number]          PATIENT TYPE:  INP   LOCATION:  2550                         FACILITY:  [facility]   PHYSICIAN:  Gabrielle Dare. Janee Morn, M.D.DATE OF BIRTH:  1975-11-28   DATE OF PROCEDURE:  DATE OF DISCHARGE:                               OPERATIVE REPORT   PREOPERATIVE DIAGNOSES:  Acute appendicitis.   POSTPROCEDURE DIAGNOSES:  1. Acute appendicitis.  2. Pelvic adhesions.   PROCEDURE:  1. Laparoscopic lysis of adhesions.  2. Laparoscopic appendectomy.   SURGEON:  Violeta Gelinas.   ANESTHESIA:  General.   INDICATIONS FOR PROCEDURE:  The patient is 35 year old white female, who  presented to the [facility] Emergency Department with right lower  quadrant abdominal pain, nausea and vomiting since 4 a.m. this morning.  Work up included CT scan of the abdomen and pelvis showing acute  appendicitis.  She is brought for emergency appendectomy.   PROCEDURE IN DETAIL:  The patient received intravenous antibiotics.  She  was identified in the preop holding area.  Informed consent was  obtained.  She was brought to the operating room.  General anesthesia  was administered.  Her abdomen was prepped and draped in a sterile  fashion.  The infraumbilical region was infiltrated with quarter percent  Marcaine with epinephrine.  An infraumbilical incision was made along  her previous scar.  Subcutaneous tissues were carefully dissected down  revealing the anterior fascia.  This was divided sharply and the  peritoneal cavity was gently entered under direct vision without  difficulty.  A 0 Vicryl pursestring suture was placed around the fascial  opening and the Hassan trocar was inserted in the abdomen.  The abdomen  was insufflated with carbon dioxide in a standard fashion.  Under  direct vision, the left lower quadrant and right upper quadrant  __________ 12-mm left lower quadrant and a 5-mm right upper quadrant  port were placed, quarter percent Marcaine with epinephrine was used at  all port sites.  Laparoscopic exploration revealed some filmy omental  adhesions in the pelvis.  These were gradually taken down using cautery  scissors and gentle blunt dissection.  The bowel was not involved.  These were omental adhesions which were gradually cleared away freeing  up further exploration of the right and lower quadrant.  This  demonstrated an inflamed appendix that was not perforated.  The base of  the appendix was normal.  It was dissected free from the mesoappendix.  The base of the appendix was divided with endoscopic GIA stapler with a  vascular load.  The mesoappendix was then divided with endoscopic GIA  stapler with a vascular load.  The appendix was placed in an EndoCatch  bag and removed from the abdomen via the left lower quadrant port site.  The abdomen was copiously irrigated with saline.  Just under 2 liters  were used.  Staple lines were checked and excellent hemostasis was  ensured.  The remainder of the irrigation fluid was evacuated and it  returned clear.  The omentum was examined from the adhesiolysis and  there was no  bleeding.  The remainder of the irrigation fluid was  evacuated.  Staple lines were rechecked and remained dry.  The ports  were removed under direct vision.  The pneumoperitoneum was released.  The [facility] trocar was removed.  The infraumbilical fascia was closed by  tying the 0 Vicryl pursestring suture with care not to trap any intra-  abdominal contents.  All three wounds were copiously irrigated and the  skin of each was closed with running 4-0 Vicryl subcuticular stitch.  Sponge, needle and instrument counts were correct.  Benzoin, Steri-  Strips and sterile dressings were applied.  The patient tolerated the  procedure well without apparent complication and was taken to the  recovery room in stable condition.      Gabrielle Dare Janee Morn, M.D.  Electronically  Signed     BET/MEDQ  D:  10/07/2006  T:  10/07/2006  Job:  [id number]
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
## 2011-02-22 NOTE — Discharge Summary (Signed)
[name], [name]              ACCOUNT NO.:  [id number]   MEDICAL RECORD NO.:  [id number]          PATIENT TYPE:  INP   LOCATION:  5731                         FACILITY:  [facility]   PHYSICIAN:  Amber Dare. Janee Butler, M.D.DATE OF BIRTH:  1976-05-04   DATE OF ADMISSION:  10/07/2006  DATE OF DISCHARGE:                               DISCHARGE SUMMARY   DATE OF DISCHARGE:  October 08, 2006   DISCHARGE DIAGNOSES:  1. Acute appendicitis.  2. Status post laparoscopic appendectomy and laparoscopic lysis of      adhesions.   HISTORY OF PRESENT ILLNESS:  The patient is a 35 year old female who was  admitted yesterday with signs and symptoms of acute appendicitis, and  she was taken to the operating room for emergent appendectomy.   HOSPITAL COURSE:  The patient underwent an uncomplicated laparoscopic  appendectomy.  She also had laparoscopic lysis of adhesions due to  pelvic adhesions from her previous surgeries.  Postoperatively, she  remained afebrile and hemodynamically stable.  She tolerated gradual  advancement of her diet and oral pain medications.  She is discharged  home in stable condition on postoperative day 1.   DISCHARGE DIET:  Regular.   DISCHARGE ACTIVITY:  No lifting.   DISCHARGE MEDICATIONS:  Percocet 5/325, one to two every 6 hours as  needed for pain.  She is also to continue her home medications, which  include Lyrica 75 mg bid as well as Advair and Singulair daily.   FOLLOWUP:  In three weeks with myself.      [name], M.D.  Electronically Signed     BET/MEDQ  D:  10/08/2006  T:  10/08/2006  Job:  [id number]

## 2011-02-22 NOTE — Op Note (Signed)
NAMESHARUNDA, SALMON                        ACCOUNT NO.:  [id number]   MEDICAL RECORD NO.:  [id number]                   PATIENT TYPE:  INP   LOCATION:  9137                                 FACILITY:  [facility]   PHYSICIAN:  Phil D. Okey Dupre, M.D.                  DATE OF BIRTH:  [birth year]   DATE OF PROCEDURE:  12/07/2003  DATE OF DISCHARGE:                                 OPERATIVE REPORT   PREOPERATIVE DIAGNOSIS:  Voluntary sterilization.   POSTOPERATIVE DIAGNOSIS:  Voluntary sterilization.   PROCEDURE:  Bilateral tubal ligation with partial salpingectomy, bilateral.   SURGEON:  Phil D. Okey Dupre, M.D.   ANESTHESIA:  Epidural.   The procedure went as follows:  Under satisfactory epidural anesthesia with  the patient in the dorsal supine position, the abdomen was prepped and  draped in the usual sterile manner and entered through a  subumbilical  incision measuring 5 cm in length and situated 1 cm below the umbilicus.  It  was entered by layers.  On entering the peritoneal cavity, the fallopian  tubes were identified, grasped upon their midportion, and an opening made in  the avascular portion of the mesosalpinx beneath the tube and through that  drawn a 1 plain catgut suture, which was tied around the distal and proximal  ends of the tube, forming a loop of tube approximately 2 cm above the tie.  A second tie was placed of the same material below the first and the section  of the tube above the ties was excised.  The exposed ends of the tube were  coagulated with hot cautery and the area was observed for bleeding, none was  noted.  The peritoneum and fascia were closed in one layer with a continuous  running 0 Vicryl on an atraumatic needle, which was continued up to a  subcutaneous and subcuticular closure.  A dry sterile dressing was applied.  The patient tolerated the procedure well with less than 5 mL of blood loss.  Tape, instrument, sponge, and needle count were reported correct at  the end  of the procedure.                                               Phil D. Okey Dupre, M.D.    PDR/MEDQ  D:  12/07/2003  T:  12/07/2003  Job:  [id number]

## 2011-02-22 NOTE — Discharge Summary (Signed)
[facility]  Patient:    [name], [name] Visit Number: [id number] MRN: [id number]          Service Type: MED Location: [id number] Attending Physician:  McDiarmid, Leighton Roach. Dictated by:   Harrold Donath, M.D. Admit Date:  02/08/2002 Discharge Date: 02/09/2002                             Discharge Summary  DISCHARGE DIAGNOSES: 1. Viral gastroenteritis. 2. Resolved hematuria.  DISCHARGE MEDICATIONS: 1. Phenergan 12.5 mg p.o. q.4-6h. p.r.n. nausea and her previous medications    which include: 2. Advair 500/50, 1 puff b.i.d. 3. Elavil 50 mg p.o. q.h.s. 4. Claritin-D 1 p.o. q.d. 5. Singulair 10 mg p.o. q.h.s. 6. Necon 1 p.o. q.d. 7. Albuterol inhaler 2 puffs q.4h. p.r.n.  ADMISSION HISTORY:  The patient is a 35 year old female, who presented with one week of stomach cramps with nausea and vomiting for several days.  She also had 2-3 days of diarrhea which resolved and some low-grade fevers.  She states that she ate some barbecued chicken the week prior to admission and otherwise had no other symptoms.  She was admitted for work-up of her vomiting and cramps.  HOSPITAL COURSE: #1 - VIRAL GASTROENTERITIS:  On admission, the patient was noted to be afebrile but tachycardic at 100.  Her abdominal exam was significant for epigastric and right upper quadrant tenderness.  A urine pregnancy test was done which was negative.  Her urinalysis on the 1st of May showed too numerous to count red blood cells, and a wet prep 3-5 white blood cells, and a GC and Chlamydia are pending.  An amylase and lipase were ordered and were normal at 59 and 29.  She had a normal white count of 10.8.  Abdominal films were done and were negative for stones or obstruction.  The patient was put on IV Phenergan and clear fluids and did well overnight.  She was aggressively hydrated secondary to some dehydration with the urinalysis of 80 ketones and specific gravity of  1.030.  She did well overnight and was advanced to a regular diet in the morning and did well with this.  Since her work-up was negative, it was decided that the patient likely had a viral gastroenteritis.  #2 - HEMATURIA:  The hematuria resolved.  A urinalysis from this admission showed 0-2 red blood cells and no signs of infection.  The prior UA may have been contaminated with menstrual bleeding, or the patient may have had a mild tubular necrosis from dehydration.  This will need to be followed up with her primary care physician.  #3 - SUBSTANCE ABUSE:  Also of note, the patients urine drug screen was positive for marijuana.  CONDITION ON DISCHARGE:  The patient was discharged to home in stable condition.  PATIENT INSTRUCTIONS:  The patient was told to continue her home medications as well as the Phenergan as needed.  She also has an appointment with Dr. Milinda Cave on Feb 25, 2002. Dictated by:   Harrold Donath, M.D. Attending Physician:  McDiarmid, Tawanna Cooler D. DD:  02/09/02 TD:  02/12/02 Job: [id number]
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
## 2011-03-01 ENCOUNTER — Encounter: Payer: Self-pay | Admitting: Family Medicine

## 2011-03-01 ENCOUNTER — Ambulatory Visit (INDEPENDENT_AMBULATORY_CARE_PROVIDER_SITE_OTHER): Payer: BLUE CROSS/BLUE SHIELD | Admitting: Family Medicine

## 2011-03-01 DIAGNOSIS — G5600 Carpal tunnel syndrome, unspecified upper limb: Secondary | ICD-10-CM

## 2011-03-01 DIAGNOSIS — M79609 Pain in unspecified limb: Secondary | ICD-10-CM

## 2011-03-01 DIAGNOSIS — Z91038 Other insect allergy status: Secondary | ICD-10-CM

## 2011-03-01 DIAGNOSIS — J45909 Unspecified asthma, uncomplicated: Secondary | ICD-10-CM

## 2011-03-01 DIAGNOSIS — M79646 Pain in unspecified finger(s): Secondary | ICD-10-CM

## 2011-03-01 DIAGNOSIS — M779 Enthesopathy, unspecified: Secondary | ICD-10-CM

## 2011-03-01 MED ORDER — TRAMADOL HCL 50 MG PO TABS: 50.0000 mg | ORAL_TABLET | Freq: Four times a day (QID) | ORAL | Status: AC | PRN

## 2011-03-01 MED ORDER — EPINEPHRINE 0.3 MG/0.3ML IJ DEVI: 0.3000 mg | Freq: Once | INTRAMUSCULAR | Status: AC

## 2011-03-01 MED ORDER — ALBUTEROL SULFATE HFA 108 (90 BASE) MCG/ACT IN AERS: 2.0000 | INHALATION_SPRAY | Freq: Four times a day (QID) | RESPIRATORY_TRACT | Status: DC | PRN

## 2011-03-01 NOTE — Patient Instructions (Signed)
Schedule the Advil 800 mg three times a day (breakfast, lunch, dinner.) for  7 days.   Take the Tramadol as needed every 6 hours.   We will refer you to Sports Medicine.

## 2011-03-05 DIAGNOSIS — M79646 Pain in unspecified finger(s): Secondary | ICD-10-CM | POA: Insufficient documentation

## 2011-03-05 NOTE — Assessment & Plan Note (Signed)
Improved, to continue using splint.

## 2011-03-05 NOTE — Progress Notes (Signed)
  Subjective:    Patient ID: [name], female    DOB: Jun 17, 1976, 35 y.o.   MRN: [id number]  HPI 1.  Hand pain:  Persists.  Has been using splint at night, and paresthesias have improved since using them bilaterally.  Pain now more localized to plantar aspect of Right thumb.  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  By end of day, hand is throbbing.  Using 3 tabs Ibuprofen about 3 times a day, but still in pain at end of day.  Would like relief.  No joint pain elsewhere, fever, rash   Review of Systems See HPI above for review of systems.       Objective:   Physical Exam Gen:  Alert, cooperative patient who appears stated age in no acute distress.  Vital signs reviewed. MSK: No decreased strength BL hands  Thickening of thumb flexor tendon noted on Right side compared to Left.  Pain on abduction and opposition of thumb movement.  No pain elsewhere in fingers. Neuro:  No decreased sensation or decreased motor strength BL hands/fingers.   Skin - no sores or suspicious lesions or rashes or color changes        Assessment & Plan:

## 2011-03-05 NOTE — Assessment & Plan Note (Signed)
As patient has such localized pain and such improvement with carpal tunnel, will refer to Sports Medicine for recommendations. Possible tendonitis of thumb flexor tendon versus arthritis of joint.   Not de Quervain's based on exam and history.

## 2011-03-06 ENCOUNTER — Encounter: Payer: Self-pay | Admitting: Family Medicine

## 2011-03-06 ENCOUNTER — Ambulatory Visit (INDEPENDENT_AMBULATORY_CARE_PROVIDER_SITE_OTHER): Payer: BLUE CROSS/BLUE SHIELD | Admitting: Family Medicine

## 2011-03-06 DIAGNOSIS — M6530 Trigger finger, unspecified finger: Secondary | ICD-10-CM

## 2011-03-06 DIAGNOSIS — M65311 Trigger thumb, right thumb: Secondary | ICD-10-CM

## 2011-03-06 DIAGNOSIS — G5600 Carpal tunnel syndrome, unspecified upper limb: Secondary | ICD-10-CM

## 2011-03-08 DIAGNOSIS — M65311 Trigger thumb, right thumb: Secondary | ICD-10-CM | POA: Insufficient documentation

## 2011-03-08 NOTE — Progress Notes (Signed)
  Subjective:    Patient ID: [name], female    DOB: 04-25-1976, 35 y.o.   MRN: [id number]  HPI 35 year old female referred by Dr. Gwendolyn Grant at the family practice clinic for evaluation of right hand, thumb, and wrist pain. Patient has had a history of carpal tunnel syndrome, that has responded somewhat to conservative therapy with cock up wrist splints and anti-inflammatories. However the last several weeks she's had a worsening pain in the right base of the thumb region. She states the pain is constant and worse with movement. She says occasionally it will catch and trigger on her. She is sent down here for further evaluation and management.  In regards to her carpal tunnel symptoms she still is getting some numbness and tingling in the finger, but does state it is better with the cock-up splint   Review of Systems Denies fever, sweats, chills, weight loss    Objective:   Physical Exam Gen. appearance: Well appearing female no distress Right wrist: Full range of motion, she is able make a full fist. Positive Tinel's and durkens test. Positive Phalen's test. Still has good muscular definition of the thenar muscles. Right hand: Positive tenderness at the base of the thumb. She does have significant tenderness over the A1 pulley and although I cannot get her to trigger, I can feel some crepitus and grinding over the flexor tendon of the thumb.  Muscle skeletal ultrasound right wrist: Slightly thickened median nerve on the transverse view, with some mild surrounding fluid. However it does appear to be mobile within the carpal tall. Muscle skeletal ultrasound of right thumb and hand: Significant edema was found around the flexor pollicis tendon.       Assessment & Plan:  #1 right trigger thumb -Discussed options, proceeded with injection today, see procedure note blow -Followup in one month  #2 carpal tunnel syndrome -Because she has done somewhat well with conservative therapy, I  would continue doing her cockup wrist splints and anti-inflammatories -I'm happy to do a carpal tunnel injection in the future, however she had a fair amount of pain with trigger thumb injection, and was not crazy about getting another injection today -Followup 1 month  Consent obtained and verified. Sterile betadine prep. Furthur cleansed with alcohol. Topical analgesic spray: Ethyl chloride. Joint: Rt trigger thumb A1 pulley Approached in typical fashion with: direct approach into nodule Completed without difficulty Meds:0.4 cc kenalog 10 mg and 0.4 cc 1% lidocaine Needle: 30 G 5/8 inch TB Aftercare instructions and Red flags advised.

## 2011-03-14 ENCOUNTER — Other Ambulatory Visit: Payer: BLUE CROSS/BLUE SHIELD | Admitting: Family Medicine

## 2011-03-27 ENCOUNTER — Encounter: Payer: Self-pay | Admitting: Family Medicine

## 2011-03-27 ENCOUNTER — Ambulatory Visit (INDEPENDENT_AMBULATORY_CARE_PROVIDER_SITE_OTHER): Payer: BLUE CROSS/BLUE SHIELD | Admitting: Family Medicine

## 2011-03-27 VITALS — BP 123/80

## 2011-03-27 DIAGNOSIS — G5600 Carpal tunnel syndrome, unspecified upper limb: Secondary | ICD-10-CM

## 2011-03-27 DIAGNOSIS — M6530 Trigger finger, unspecified finger: Secondary | ICD-10-CM

## 2011-03-27 DIAGNOSIS — M65311 Trigger thumb, right thumb: Secondary | ICD-10-CM

## 2011-03-27 MED ORDER — PREDNISONE 5 MG PO TABS: ORAL_TABLET | ORAL | Status: DC

## 2011-03-27 NOTE — Progress Notes (Signed)
  Subjective:    Patient ID: [name], female    DOB: 12-Oct-1975, 35 y.o.   MRN: [id number]  HPI 35 yo F f/u Rt thumb trigger.  Injected last visit, now at least 50% improved.  Still with some pain, but no longer triggering.  CTS in both hands, better with bracing.  Still gets occasional morning stiffness throughout b/l wrists, hands, and fingers.  Denies family h/o RA.   Review of Systems Denies F,C,S    Objective:   Physical Exam Gen: NAD Rt hand: + palpable nodule with ttp at base of thumb, unable to trigger. B/l wrists: neg Tinel's and Durkins.  Good thenar definition.  No obvious RA deformities in b/l wrists, hands, or fingers.       Assessment & Plan:  Right trigger thumb, now improved s/p injection - given some residual pain, trial of 6 day pred taper - one time hand therapy referral for HEP - f/u 1 month, if worsens can consider repeat injection vs ortho referral for release surgery  B/l CTS - at this point, symptoms well controleld with bracing - f/u if desired for CSI

## 2011-05-07 ENCOUNTER — Ambulatory Visit: Payer: BLUE CROSS/BLUE SHIELD | Admitting: Family Medicine

## 2011-05-14 ENCOUNTER — Encounter: Payer: Self-pay | Admitting: Family Medicine

## 2011-05-14 ENCOUNTER — Ambulatory Visit (INDEPENDENT_AMBULATORY_CARE_PROVIDER_SITE_OTHER): Payer: BLUE CROSS/BLUE SHIELD | Admitting: Family Medicine

## 2011-05-14 VITALS — BP 124/73 | HR 84 | Temp 98.1°F | Wt 181.0 lb

## 2011-05-14 DIAGNOSIS — G4485 Primary stabbing headache: Secondary | ICD-10-CM | POA: Insufficient documentation

## 2011-05-14 MED ORDER — INDOMETHACIN ER 75 MG PO CPCR: 75.0000 mg | ORAL_CAPSULE | Freq: Every day | ORAL | Status: AC

## 2011-05-14 NOTE — Patient Instructions (Signed)
I think that you have "stabbing headaches"  These are caused by nerve disfunction  If your headache changes, you get weakness, dizziness, or vision changes, call us back  Come to see Dr. Gwendolyn Grant in 2 weeks for check up  Try to keep a record of your headaches to bring to your appt

## 2011-05-14 NOTE — Assessment & Plan Note (Signed)
precepted with Dr. McDiarmid.  No imaging with normal neuro exam.  Indomethacin 75 q day.  RTC in 2 weeks.

## 2011-05-14 NOTE — Progress Notes (Signed)
  Subjective:    [name] is a 35 y.o. female who presents for evaluation of headache. Symptoms began about 4 days ago. Generally, the headaches last about 4 minutes and occur several times per day. The headaches do not seem to be related to any time of the day. The headaches are usually sharp and are located in right temple.  The patient rates her most severe headaches a 7 on a scale from 1 to 10. Recently, the headaches have been stable. Work attendance or other daily activities are affected by the headaches. Precipitating factors include: none which have been determined. The headaches are usually not preceded by an aura. Associated neurologic symptoms: none. The patient denies decreased physical activity, depression, dizziness, loss of balance, muscle weakness, numbness of extremities, speech difficulties, vision problems, vomiting in the early morning and worsening school/work performance. Home treatment has included acetaminophen and ibuprofen with little improvement. Other history includes: migraine headaches diagnosed in the past and allergic rhinitis. Family history includes none.  The following portions of the patient's history were reviewed and updated as appropriate: allergies, current medications and past medical history.  Review of Systems Pertinent items are noted in HPI.    Objective:    BP 124/73  Pulse 84  Temp(Src) 98.1 F (36.7 C) (Oral)  Wt 181 lb (82.101 kg)  LMP 05/04/2011 General appearance: alert, cooperative, appears stated age and no distress Head: Normocephalic, without obvious abnormality, atraumatic Eyes: conjunctivae/corneas clear. PERRL, EOM's intact. Fundi benign. Neurologic: Alert and oriented X 3, normal strength and tone. Normal symmetric reflexes. Normal coordination and gait Mental status: Alert, oriented, thought content appropriate Cranial nerves: normal Sensory: normal Motor: grossly normal Reflexes: 2+ and symmetric Coordination:  normal Gait: Normal    Assessment:    stabbing headache    Plan:    Asked to keep headache diary. Patient reassured that neurodiagnostic workup not indicated from benign H&P. Follow up in 2 week. Take indomethacin 75mg  q day

## 2011-05-16 ENCOUNTER — Ambulatory Visit (INDEPENDENT_AMBULATORY_CARE_PROVIDER_SITE_OTHER): Payer: BLUE CROSS/BLUE SHIELD | Admitting: Family Medicine

## 2011-05-16 VITALS — BP 120/82

## 2011-05-16 DIAGNOSIS — M6530 Trigger finger, unspecified finger: Secondary | ICD-10-CM

## 2011-05-16 DIAGNOSIS — M65311 Trigger thumb, right thumb: Secondary | ICD-10-CM

## 2011-05-16 MED ORDER — NAPROXEN 500 MG PO TABS: 500.0000 mg | ORAL_TABLET | Freq: Two times a day (BID) | ORAL | Status: DC

## 2011-05-19 NOTE — Progress Notes (Signed)
Subjective:    Patient ID: [name], female    DOB: 07-03-1976, 35 y.o.   MRN: [id number]  HPI  Ms.Loedermilk is a 35 yo female patient complaining of right trigger thumb locking and pain. This is a recurrent problem. She had a cortisone injection in May/2012 which helped her a lot. She was doing well, but in the last 2 weeks she has noticed pain with motion on his thumb and locking on and off. No numbness and tingling. She thinks that the pain and locking is getting worse. She denies any injury to her thumb. Her pain is sharp,5/10, no radiated, worsen by motion, improve by resting and not moving her thumb. She would like to try another injection today.  Past Medical History  Diagnosis Date  . Allergy   . Asthma   . Anxiety    Current Outpatient Prescriptions on File Prior to Visit  Medication Sig Dispense Refill  . albuterol (VENTOLIN HFA) 108 (90 BASE) MCG/ACT inhaler Inhale 2 puffs into the lungs 4 (four) times daily as needed. For cough  1 Inhaler  0  . EPIPEN 2-PAK 0.3 MG/0.3ML DEVI       . fluticasone (FLONASE) 50 MCG/ACT nasal spray 1 spray by Nasal route daily.  16 g  2  . indomethacin (INDOCIN SR) 75 MG CR capsule Take 1 capsule (75 mg total) by mouth daily with breakfast.  30 capsule  0  . loratadine (CLARITIN) 10 MG tablet OTC       . olopatadine (PATANOL) 0.1 % ophthalmic solution Place 1 drop into both eyes 2 (two) times daily.  5 mL  2  . predniSONE (DELTASONE) 5 MG tablet Take as directed x 6 days  21 tablet  0  . traMADol (ULTRAM) 50 MG tablet        Allergies  Allergen Reactions  . Codeine      Review of Systems  Constitutional: Negative for fever, chills and fatigue.  Musculoskeletal:       Right thumb pain and locking w/ hpi  Skin: Negative for color change, pallor and rash.  Neurological: Negative for weakness and numbness.       Objective:   Physical Exam  Constitutional: She appears well-developed and well-nourished.       BP 120/82  LMP  05/04/2011   Pulmonary/Chest: Effort normal.  Musculoskeletal:       Right thumb w/ intact skin, no swelling, no hematomas.  There is TTP on the palmar aspect of the mp joint. No nodule felt. No locking was reproduced during the examination. Sensation intact distally. FROM.  Neurological: She is alert.  Skin: Skin is warm. No rash noted. No erythema. No pallor.  Psychiatric: She has a normal mood and affect. Judgment normal.   After obtaining consent, the skin of the right 1st mp was sterilely prepped with alcohol swabs, ethyl will chloride was used for local topical anesthesia injection of 1 mL 1% lidocaine plus one mL of Kenalog was injected in the peritenon of the flexor tendon. The procedure was well-tolerated by the patient. Patient instructed to remain in clinic for 20 minutes afterwards, and to report any adverse reaction to me immediately.    Assessment & Plan:   1. Trigger thumb of right hand  naproxen (NAPROSYN) 500 MG tablet  Cortisone injection repeated. It was explained to the patient the nature of her condition and the need for a surgical solution if the problem recurred. Naproxen for pain control. F/U in 4 weeks.

## 2011-06-05 ENCOUNTER — Ambulatory Visit: Payer: BLUE CROSS/BLUE SHIELD | Admitting: Family Medicine

## 2011-06-05 ENCOUNTER — Inpatient Hospital Stay (HOSPITAL_COMMUNITY)
Admission: EM | Admit: 2011-06-05 | Discharge: 2011-06-06 | DRG: 450 | Disposition: A | Discharge status: Home / Self Care | Payer: BC Managed Care – PPO | Attending: Family Medicine | Admitting: Family Medicine

## 2011-06-05 ENCOUNTER — Encounter: Payer: Self-pay | Admitting: Family Medicine

## 2011-06-05 ENCOUNTER — Emergency Department (HOSPITAL_COMMUNITY): Payer: BC Managed Care – PPO

## 2011-06-05 DIAGNOSIS — J45909 Unspecified asthma, uncomplicated: Secondary | ICD-10-CM | POA: Diagnosis present

## 2011-06-05 DIAGNOSIS — T6391XA Toxic effect of contact with unspecified venomous animal, accidental (unintentional), initial encounter: Principal | ICD-10-CM | POA: Diagnosis present

## 2011-06-05 DIAGNOSIS — T63461A Toxic effect of venom of wasps, accidental (unintentional), initial encounter: Secondary | ICD-10-CM | POA: Diagnosis present

## 2011-06-05 DIAGNOSIS — T782XXA Anaphylactic shock, unspecified, initial encounter: Secondary | ICD-10-CM

## 2011-06-05 DIAGNOSIS — F411 Generalized anxiety disorder: Secondary | ICD-10-CM

## 2011-06-05 DIAGNOSIS — Z87891 Personal history of nicotine dependence: Secondary | ICD-10-CM

## 2011-06-05 DIAGNOSIS — Y9289 Other specified places as the place of occurrence of the external cause: Secondary | ICD-10-CM
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
## 2011-06-13 ENCOUNTER — Ambulatory Visit (INDEPENDENT_AMBULATORY_CARE_PROVIDER_SITE_OTHER): Payer: BC Managed Care – PPO | Admitting: Family Medicine

## 2011-06-13 ENCOUNTER — Encounter: Payer: Self-pay | Admitting: Family Medicine

## 2011-06-13 VITALS — BP 117/72 | HR 93 | Ht 63.0 in | Wt 185.0 lb

## 2011-06-13 DIAGNOSIS — M65311 Trigger thumb, right thumb: Secondary | ICD-10-CM

## 2011-06-13 DIAGNOSIS — M6530 Trigger finger, unspecified finger: Secondary | ICD-10-CM

## 2011-06-13 DIAGNOSIS — T63441A Toxic effect of venom of bees, accidental (unintentional), initial encounter: Secondary | ICD-10-CM

## 2011-06-13 DIAGNOSIS — T6391XA Toxic effect of contact with unspecified venomous animal, accidental (unintentional), initial encounter: Secondary | ICD-10-CM

## 2011-06-13 DIAGNOSIS — T63461A Toxic effect of venom of wasps, accidental (unintentional), initial encounter: Secondary | ICD-10-CM
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
## 2011-06-13 NOTE — Patient Instructions (Signed)
You have been scheduled for an appointment with Dr. Teressa Senter for 06/21/11 @ 1:30 pm Their address is [address] [place], [place] [address] Phone number is [phone]

## 2011-06-14 NOTE — Progress Notes (Signed)
Subjective:    Patient ID: [name], female    DOB: 1975/10/12, 35 y.o.   MRN: [id number]  HPI  [name] is a 35 yo coming today for followup of her right trigger thumb. We saw her previously on 05/16/2011 . He did a cortisone injection on her right thumb . She states that the cortisone shot helped her for the next 3 weeks. This is the second cortisone shot done on her right thumb. The first one lasted for a month. 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 She state that her pain is sharp, located on the palmar aspect of his thumb, worsened with flexion, her right thumb hasn't locked yet but she's know that he is going to happen again. No radiating. No numbness or tingling. She denies any injury to her thumb.  Past Medical History  Diagnosis Date  . Allergy   . Asthma   . Anxiety    Current Outpatient Prescriptions on File Prior to Visit  Medication Sig Dispense Refill  . albuterol (VENTOLIN HFA) 108 (90 BASE) MCG/ACT inhaler Inhale 2 puffs into the lungs 4 (four) times daily as needed. For cough  1 Inhaler  0  . EPIPEN 2-PAK 0.3 MG/0.3ML DEVI       . fluticasone (FLONASE) 50 MCG/ACT nasal spray 1 spray by Nasal route daily.  16 g  2  . indomethacin (INDOCIN SR) 75 MG CR capsule Take 1 capsule (75 mg total) by mouth daily with breakfast.  30 capsule  0  . loratadine (CLARITIN) 10 MG tablet OTC       . naproxen (NAPROSYN) 500 MG tablet Take 1 tablet (500 mg total) by mouth 2 (two) times daily with a meal.  30 tablet  1  . olopatadine (PATANOL) 0.1 % ophthalmic solution Place 1 drop into both eyes 2 (two) times daily.  5 mL  2  . predniSONE (DELTASONE) 5 MG tablet Take as directed x 6 days  21 tablet  0  . traMADol (ULTRAM) 50 MG tablet        Allergies  Allergen Reactions  . Bee   . Codeine      Review of Systems  Constitutional: Negative for fever, chills and diaphoresis.  Musculoskeletal: Negative for back pain.       Right thumb pain w/hpi    Neurological: Negative for weakness and numbness.       Objective:   Physical Exam  Constitutional: She appears well-developed and well-nourished.       BP 117/72  Pulse 93  Ht 5\' 3"  (1.6 m)  Wt 185 lb (83.915 kg)  BMI 32.77 kg/m2   Eyes: EOM are normal.  Pulmonary/Chest: Effort normal.  Musculoskeletal:       Right thumb with intact skin. No swelling no hematomas. Range of motion with decreased flexion to a 90 due to pain . Not locking of the flexor tendon at this time . Tenderness to palpation in the palmar aspect of the proximal palm following the last of the flexor tendon . Sensation is intact distally .  Skin: Skin is warm. No rash noted. No erythema. No pallor.  Psychiatric: She has a normal mood and affect. Judgment normal.          Assessment & Plan:   1. Trigger thumb of right hand  Ambulatory referral to Hand Surgery Dr. Elonda Husky. ( Ph : [phone])  She has a recurrent trigger thumb right hand, her conservative treatment with  cortisone injections, stretching and strengthening exercise have not been satisfactory. We believed at this point is recommended a referral for hand surgery evaluation for possible surgical release of the flexor pulley.

## 2011-06-18 DIAGNOSIS — T63441A Toxic effect of venom of bees, accidental (unintentional), initial encounter: Secondary | ICD-10-CM | POA: Insufficient documentation

## 2011-06-18 NOTE — Progress Notes (Signed)
  Subjective:    Patient ID: [name], female    DOB: 17-Sep-1976, 35 y.o.   MRN: [id number]  HPI 1.  Follow-up from hospitalization:  Patient stung by bee, did not have Epi pen on her.  [facility], [facility].  EMS called.  Admitted to hospital overnight and DC'ed home next day.  No adverse reactions since then.  Has Epi pen on her person at all times since being given prescription in ED.  Currently no trouble swallowing, breathing, rash, itching, nausea, vomiting.     Review of Systems See HPI above for review of systems.       Objective:   Physical Exam Gen:  Alert, cooperative patient who appears stated age in no acute distress.  Vital signs reviewed. Mouth:  Tonsills non-erythematous, patent airway. Neck:  Supple without goiter Pulm:  Clear to auscultation bilaterally with good air movement.  No wheezes or rales noted.   Skin:  No rash       Assessment & Plan:

## 2011-06-18 NOTE — Assessment & Plan Note (Signed)
Patient doing well currently.  Has Epi-pen on her person.   No further adverse reactions.

## 2011-07-02 NOTE — Discharge Summary (Signed)
  [name], [name]              ACCOUNT NO.:  [id number]  MEDICAL RECORD NO.:  [id number]  LOCATION:  4738                         FACILITY:  [facility]  PHYSICIAN:  Pearlean Brownie, M.D.DATE OF BIRTH:  08/27/1976  DATE OF ADMISSION:  06/05/2011 DATE OF DISCHARGE:  06/06/2011                              DISCHARGE SUMMARY   PRIMARY CARE PROVIDER:  Renold Don, MD at [facility].  DISCHARGE DIAGNOSIS:  Anaphylactic reaction to bee sting.  DISCHARGE MEDICATIONS:  New medications: 1. EpiPen 1 injection intramuscularly as needed for anaphylaxis. 2. Prednisone 40 mg p.o. daily x2 days.  Continued home medications: 1. Indomethacin sustained release 75 mg p.o. q.a.m. 2. Naproxen 500 mg p.o. b.i.d. 3. Proventil inhaler 1 puff inhaled daily p.r.n. shortness of breath. 4. Over-the-counter sleep medication.  CONSULTS:  None.  PROCEDURES: 1. Chest x-ray was normal. 2. X-ray of the neck showed collapse of oropharyngeal airway and     possible enlargement of the epiglottis.  BRIEF HOSPITAL COURSE:  This is a 35 year old female with a history of multiple known allergens presenting with anaphylaxis following a bee staying.  The patient did well while in the hospital.  Her chest tightness and difficulty breathing improved by the time she rise to the emergency room after receiving a shot of epinephrine, Solu-Medrol, and Zantac on the way to the hospital in the EMS vehicle.  The patient was kept overnight because she was still complaining of some residual chest tightness, and she had some bilateral wheezing on physical exam.  The following day, on the day of discharge, the patient was breathing comfortably, had no symptomatic complaints, and her lungs sounded clear bilaterally with no wheezing or rhonchi.  The patient was discharged to home in stable medical condition.  FOLLOWUP APPOINTMENTS:  The patient was asked to follow up with her PCP, Dr. Renold Don in 1-2  weeks.  The patient was asked to call to make this appointment.  DISCHARGE INSTRUCTIONS:  The patient was asked to call her PCP, return to the ED or urgent care if she experience any difficulty breathing or chest tightness.  The patient was given a prescription for an EpiPen and instructed how to use the EpiPen, and then asked to ask for further demonstration at the Pharmacy.    ______________________________ Priscella Mann, MD   ______________________________ Pearlean Brownie, M.D.    AO/MEDQ  D:  06/06/2011  T:  06/06/2011  Job:  [id number]  cc:   Renold Don, MD  Electronically Signed by Priscella Mann MD on 06/20/2011 11:09:10 AM Electronically Signed by Pearlean Brownie M.D. on 07/02/2011 11:06:13 AM

## 2011-07-05 ENCOUNTER — Ambulatory Visit (HOSPITAL_BASED_OUTPATIENT_CLINIC_OR_DEPARTMENT_OTHER)
Admission: RE | Admit: 2011-07-05 | Discharge: 2011-07-05 | Disposition: A | Discharge status: Home / Self Care | Payer: BC Managed Care – PPO | Source: Ambulatory Visit | Attending: Orthopedic Surgery | Admitting: Orthopedic Surgery

## 2011-07-05 DIAGNOSIS — M65839 Other synovitis and tenosynovitis, unspecified forearm: Secondary | ICD-10-CM | POA: Insufficient documentation

## 2011-07-05 HISTORY — PX: TRIGGER FINGER RELEASE: SHX641

## 2011-07-08 LAB — POCT HEMOGLOBIN-HEMACUE: Hemoglobin: 13.5 g/dL (ref 12.0–15.0)

## 2011-07-11 NOTE — Op Note (Signed)
NAMEJALIA, ZUNIGA              ACCOUNT NO.:  [id number]  MEDICAL RECORD NO.:  [id number]  LOCATION:                                 FACILITY:  PHYSICIAN:  Katy Fitch. Macala Baldonado, M.D. DATE OF BIRTH:  Jun 09, 1976  DATE OF PROCEDURE:  07/05/2011 DATE OF DISCHARGE:                              OPERATIVE REPORT   PREOPERATIVE DIAGNOSIS:  Locking stenosing tenosynovitis, right thumb, status post failed injection and oral steroid treatment.  POSTOPERATIVE DIAGNOSIS:  Chronic stenosing tenosynovitis, right thumb with partial tendon necrosis.  OPERATION:  Release of A1 pulley, right thumb and debridement of necrotic tendon.  OPERATIONS:  Katy Fitch. Zaylen Susman, MD  ASSISTANT:  Marveen Reeks Dasnoit, PA-C  ANESTHESIA:  Sedation and 2% lidocaine field block and flexor sheath block, right thumb.  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  She has been treated by the sports medicine physicians at the [facility] with oral steroids, steroid injections, splinting, all without relief.  She requested a Hand Surgery consult for relief of her predicament. Clinical examination confirmed chronic stenosing tenosynovitis and crepitation suggestive of a frayed tendon.  After informed consent, she was brought to the operating at this time.  PROCEDURE:  [name] was brought to room #1 of the [facility] and placed supine position on the operating table.  Following an anesthesia and informed consent, sedation was provided. After Betadine prep, 2% lidocaine was infiltrated into the path of the intended incision and into the flexor sheath of the right thumb.  When anesthesia is satisfactory, the arm was prepped with Betadine soap and solution, sterilely draped.  A pneumatic tourniquet was applied proximal to right brachium.  Upon exsanguination of the right arm with Esmarch bandage, arterial  tourniquet was inflated to 250 mmHg.  Following routine surgical time-out, a short incision was fashioned directly over the palpably thickened A1 pulley.  Subcutaneous tissues were carefully divided taking care to gently retract the radial proper digital nerves. The A1 pulley was thickened and opaque.  This was split with scalpel and scissors.  The flexor pollicis longus tendon was delivered.  There was central necrosis, which is typically seen after prolonged stenosing tenosynovitis.  There was an area of frayed necrotic tendon, which is again a pressure phenomenon from chronic stenosis.  The tendon was delivered with a Therapist, nutritional and the areas of necrotic tendon debrided with a scalpel and scissors.  Thereafter, free range of motion of the IP joint was recovered.  The wound was repaired with simple suture of 5-0 nylon.  Compressive dressing was applied with Xeroflo, sterile gauze, and an Ace wrap.  There were no apparent complications.  We advised [name] to begin immediate range of motion excises.  After 3 days, she may advance to a Band-Aid.  We will see her back for followup in a week for suture removal.     Katy Fitch. Harace Mccluney, M.D.     RVS/MEDQ  D:  07/05/2011  T:  07/05/2011  Job:  [id number]  cc:   Sibyl Parr. Darrick Penna, M.D.  Electronically Signed by Josephine Igo M.D. on 07/11/2011 09:37:15  AM

## 2011-11-03 ENCOUNTER — Emergency Department (INDEPENDENT_AMBULATORY_CARE_PROVIDER_SITE_OTHER)
Admission: EM | Admit: 2011-11-03 | Discharge: 2011-11-03 | Disposition: A | Discharge status: Home / Self Care | Payer: Self-pay | Source: Home / Self Care | Attending: Emergency Medicine | Admitting: Emergency Medicine

## 2011-11-03 ENCOUNTER — Emergency Department (INDEPENDENT_AMBULATORY_CARE_PROVIDER_SITE_OTHER): Payer: BC Managed Care – PPO

## 2011-11-03 ENCOUNTER — Encounter (HOSPITAL_COMMUNITY): Payer: Self-pay

## 2011-11-03 DIAGNOSIS — S139XXA Sprain of joints and ligaments of unspecified parts of neck, initial encounter: Secondary | ICD-10-CM

## 2011-11-03 DIAGNOSIS — S40019A Contusion of unspecified shoulder, initial encounter: Secondary | ICD-10-CM

## 2011-11-03 DIAGNOSIS — IMO0002 Reserved for concepts with insufficient information to code with codable children: Secondary | ICD-10-CM

## 2011-11-03 DIAGNOSIS — T148XXA Other injury of unspecified body region, initial encounter: Secondary | ICD-10-CM

## 2011-11-03 DIAGNOSIS — S46919A Strain of unspecified muscle, fascia and tendon at shoulder and upper arm level, unspecified arm, initial encounter: Secondary | ICD-10-CM

## 2011-11-03 DIAGNOSIS — S161XXA Strain of muscle, fascia and tendon at neck level, initial encounter: Secondary | ICD-10-CM

## 2011-11-03 MED ORDER — TRAMADOL HCL 50 MG PO TABS: 100.0000 mg | ORAL_TABLET | Freq: Three times a day (TID) | ORAL | Status: AC | PRN

## 2011-11-03 MED ORDER — METHOCARBAMOL 500 MG PO TABS: 500.0000 mg | ORAL_TABLET | Freq: Three times a day (TID) | ORAL | Status: AC
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
## 2011-12-26 ENCOUNTER — Encounter: Payer: Self-pay | Admitting: Family Medicine

## 2012-01-01 ENCOUNTER — Encounter: Payer: Self-pay | Admitting: Family Medicine

## 2012-01-01 ENCOUNTER — Ambulatory Visit (INDEPENDENT_AMBULATORY_CARE_PROVIDER_SITE_OTHER): Payer: BC Managed Care – PPO | Admitting: Family Medicine

## 2012-01-01 ENCOUNTER — Telehealth: Payer: Self-pay | Admitting: Family Medicine

## 2012-01-01 VITALS — BP 127/83 | HR 98 | Temp 97.7°F | Ht 63.0 in | Wt 179.3 lb

## 2012-01-01 DIAGNOSIS — B09 Unspecified viral infection characterized by skin and mucous membrane lesions: Secondary | ICD-10-CM | POA: Insufficient documentation

## 2012-01-01 MED ORDER — PREDNISONE 10 MG PO TABS: 10.0000 mg | ORAL_TABLET | Freq: Every day | ORAL | Status: DC

## 2012-01-01 NOTE — Telephone Encounter (Signed)
Spoke with patient and informed her that only the Prednisone was prescribed

## 2012-01-01 NOTE — Progress Notes (Signed)
  Subjective:    Patient ID: [name], female    DOB: 09-10-1976, 36 y.o.   MRN: [id number]  HPI 1. Rash Patient has a diffuse red macular rash that is itchy and non-tender. The rash is diffuse, but spares hands/feet/palms/soles, mucous membranes, mouth. No insect bites. No arthropathies. No viral symptoms. Contact with sick daughter that has fifths disease. She suffers from allergies and asthma and is red-headeded. No new meds/detergents. No animal contact. No travel. No drugs/alcohol/smoking. She is sexually active. No fevers, chills, night sweats. No cough, no diarrhea, no abdominal pain, no CVAT.  Review of Systems Pertinent items are noted in HPI.     Objective:   Physical Exam Filed Vitals:   01/01/12 1523  BP: 127/83  Pulse: 98  Temp: 97.7 F (36.5 C)  TempSrc: Oral  Height: 5\' 3"  (1.6 m)  Weight: 179 lb 4.8 oz (81.33 kg)  Skin:   diffuse red macular rash that is itchy and non-tender. The rash is diffuse, but spares hands/feet/palms/soles, mucous membranes, mouth. Lungs:  Normal respiratory effort, chest expands symmetrically. Lungs are clear to auscultation, no crackles or wheezes. Heart - Regular rate and rhythm.  No murmurs, gallops or rubs.    Abdomen: soft and non-tender without masses, organomegaly or hernias noted.  No guarding or rebound Extremities:   Non-tender, No cyanosis, edema, or deformity noted.    Assessment & Plan:

## 2012-01-01 NOTE — Patient Instructions (Signed)
It was great to see you today!  Schedule an appointment to see Dr. Gwendolyn Grant as needed.  This rash should go away on its own, if you get sick with new symptoms please come in and see [place].

## 2012-01-01 NOTE — Telephone Encounter (Signed)
Patient is calling because she thought Dr. Rivka Safer was going to [facility] an [facility] [address], but they just got the Rx for Prednisone.

## 2012-01-01 NOTE — Assessment & Plan Note (Signed)
Patient has diffuse itching and rash on body, sparing palms/hands/feet/mucous membranes/face/scalp. I will give her 10 mg of prednisone daily for 7 days to help with the symptoms.  No other viral symptoms.

## 2012-01-21 ENCOUNTER — Encounter: Payer: BC Managed Care – PPO | Admitting: Family Medicine

## 2012-03-07 DIAGNOSIS — G5601 Carpal tunnel syndrome, right upper limb: Secondary | ICD-10-CM

## 2012-03-07 DIAGNOSIS — M65312 Trigger thumb, left thumb: Secondary | ICD-10-CM

## 2012-03-07 HISTORY — DX: Trigger thumb, left thumb: M65.312

## 2012-03-07 HISTORY — DX: Carpal tunnel syndrome, right upper limb: G56.01

## 2012-03-12 ENCOUNTER — Encounter: Payer: BC Managed Care – PPO | Admitting: Family Medicine

## 2012-03-12 ENCOUNTER — Encounter (HOSPITAL_BASED_OUTPATIENT_CLINIC_OR_DEPARTMENT_OTHER): Payer: Self-pay | Admitting: *Deleted

## 2012-03-13 ENCOUNTER — Other Ambulatory Visit: Payer: Self-pay | Admitting: Orthopedic Surgery

## 2012-03-13 ENCOUNTER — Encounter (HOSPITAL_BASED_OUTPATIENT_CLINIC_OR_DEPARTMENT_OTHER): Payer: Self-pay | Admitting: *Deleted

## 2012-03-19 ENCOUNTER — Encounter (HOSPITAL_BASED_OUTPATIENT_CLINIC_OR_DEPARTMENT_OTHER): Payer: Self-pay

## 2012-03-19 ENCOUNTER — Ambulatory Visit (HOSPITAL_BASED_OUTPATIENT_CLINIC_OR_DEPARTMENT_OTHER): Payer: Worker's Compensation | Admitting: *Deleted

## 2012-03-19 ENCOUNTER — Encounter (HOSPITAL_BASED_OUTPATIENT_CLINIC_OR_DEPARTMENT_OTHER)
Admission: RE | Disposition: A | Discharge status: Home / Self Care | Payer: Self-pay | Source: Ambulatory Visit | Attending: Orthopedic Surgery

## 2012-03-19 ENCOUNTER — Encounter (HOSPITAL_BASED_OUTPATIENT_CLINIC_OR_DEPARTMENT_OTHER): Payer: Self-pay | Admitting: *Deleted

## 2012-03-19 ENCOUNTER — Ambulatory Visit (HOSPITAL_BASED_OUTPATIENT_CLINIC_OR_DEPARTMENT_OTHER)
Admission: RE | Admit: 2012-03-19 | Discharge: 2012-03-19 | Disposition: A | Discharge status: Home / Self Care | Payer: Worker's Compensation | Source: Ambulatory Visit | Attending: Orthopedic Surgery | Admitting: Orthopedic Surgery

## 2012-03-19 DIAGNOSIS — M65849 Other synovitis and tenosynovitis, unspecified hand: Secondary | ICD-10-CM | POA: Insufficient documentation

## 2012-03-19 DIAGNOSIS — M65839 Other synovitis and tenosynovitis, unspecified forearm: Secondary | ICD-10-CM | POA: Insufficient documentation

## 2012-03-19 DIAGNOSIS — M6530 Trigger finger, unspecified finger: Secondary | ICD-10-CM | POA: Insufficient documentation

## 2012-03-19 DIAGNOSIS — G5600 Carpal tunnel syndrome, unspecified upper limb: Secondary | ICD-10-CM | POA: Insufficient documentation

## 2012-03-19 DIAGNOSIS — J45909 Unspecified asthma, uncomplicated: Secondary | ICD-10-CM | POA: Insufficient documentation

## 2012-03-19 DIAGNOSIS — R51 Headache: Secondary | ICD-10-CM | POA: Insufficient documentation

## 2012-03-19 DIAGNOSIS — Z01812 Encounter for preprocedural laboratory examination: Secondary | ICD-10-CM | POA: Insufficient documentation

## 2012-03-19 HISTORY — DX: Carpal tunnel syndrome, right upper limb: G56.01

## 2012-03-19 HISTORY — PX: TRIGGER FINGER RELEASE: SHX641

## 2012-03-19 HISTORY — DX: Nausea with vomiting, unspecified: R11.2

## 2012-03-19 HISTORY — DX: Trigger thumb, left thumb: M65.312

## 2012-03-19 HISTORY — DX: Other specified postprocedural states: Z98.890

## 2012-03-19 HISTORY — PX: CARPAL TUNNEL RELEASE: SHX101

## 2012-03-19 HISTORY — DX: Presence of dental prosthetic device (complete) (partial): Z97.2

## 2012-03-19 SURGERY — CARPAL TUNNEL RELEASE
Anesthesia: General | Site: Wrist | Laterality: Right | Wound class: Clean

## 2012-03-19 MED ORDER — LACTATED RINGERS IV SOLN
INTRAVENOUS | Status: DC
  Administered 2012-03-19 (×2): via INTRAVENOUS

## 2012-03-19 MED ORDER — PROPOFOL 10 MG/ML IV EMUL
INTRAVENOUS | Status: DC | PRN
  Administered 2012-03-19: 250 mg via INTRAVENOUS

## 2012-03-19 MED ORDER — OXYCODONE-ACETAMINOPHEN 5-325 MG PO TABS: 1.0000 | ORAL_TABLET | Freq: Four times a day (QID) | ORAL | Status: DC | PRN

## 2012-03-19 MED ORDER — FENTANYL CITRATE 0.05 MG/ML IJ SOLN
25.0000 ug | INTRAMUSCULAR | Status: DC | PRN
  Administered 2012-03-19 (×2): 50 ug via INTRAVENOUS

## 2012-03-19 MED ORDER — LIDOCAINE HCL 2 % IJ SOLN
INTRAMUSCULAR | Status: DC | PRN
  Administered 2012-03-19: 3 mL
  Administered 2012-03-19: 1 mL

## 2012-03-19 MED ORDER — OXYCODONE-ACETAMINOPHEN 5-325 MG PO TABS: ORAL_TABLET | ORAL | Status: AC

## 2012-03-19 MED ORDER — CHLORHEXIDINE GLUCONATE 4 % EX LIQD: 60.0000 mL | Freq: Once | CUTANEOUS | Status: DC

## 2012-03-19 MED ORDER — DEXAMETHASONE SODIUM PHOSPHATE 10 MG/ML IJ SOLN
INTRAMUSCULAR | Status: DC | PRN
  Administered 2012-03-19: 10 mg via INTRAVENOUS

## 2012-03-19 MED ORDER — FENTANYL CITRATE 0.05 MG/ML IJ SOLN
INTRAMUSCULAR | Status: DC | PRN
  Administered 2012-03-19 (×2): 25 ug via INTRAVENOUS
  Administered 2012-03-19: 50 ug via INTRAVENOUS

## 2012-03-19 MED ORDER — LIDOCAINE HCL (CARDIAC) 20 MG/ML IV SOLN
INTRAVENOUS | Status: DC | PRN
  Administered 2012-03-19: 50 mg via INTRAVENOUS

## 2012-03-19 MED ORDER — DROPERIDOL 2.5 MG/ML IJ SOLN
INTRAMUSCULAR | Status: DC | PRN
  Administered 2012-03-19: 0.625 mg via INTRAVENOUS

## 2012-03-19 MED ORDER — OXYCODONE HCL 5 MG PO TABS
5.0000 mg | ORAL_TABLET | Freq: Once | ORAL | Status: AC | PRN
  Administered 2012-03-19: 5 mg via ORAL

## 2012-03-19 MED ORDER — METOCLOPRAMIDE HCL 5 MG/ML IJ SOLN: 10.0000 mg | Freq: Once | INTRAMUSCULAR | Status: DC | PRN

## 2012-03-19 MED ORDER — ONDANSETRON HCL 4 MG/2ML IJ SOLN
INTRAMUSCULAR | Status: DC | PRN
  Administered 2012-03-19: 4 mg via INTRAVENOUS

## 2012-03-19 SURGICAL SUPPLY — 47 items
BANDAGE ADHESIVE 1X3 (GAUZE/BANDAGES/DRESSINGS) IMPLANT
BANDAGE ELASTIC 3 VELCRO ST LF (GAUZE/BANDAGES/DRESSINGS) ×3 IMPLANT
BLADE SURG 15 STRL LF DISP TIS (BLADE) ×2 IMPLANT
BLADE SURG 15 STRL SS (BLADE) ×3
BNDG CMPR 9X4 STRL LF SNTH (GAUZE/BANDAGES/DRESSINGS) ×2
BNDG CMPR MD 5X2 ELC HKLP STRL (GAUZE/BANDAGES/DRESSINGS) ×2
BNDG ELASTIC 2 VLCR STRL LF (GAUZE/BANDAGES/DRESSINGS) ×3 IMPLANT
BNDG ESMARK 4X9 LF (GAUZE/BANDAGES/DRESSINGS) ×1 IMPLANT
BRUSH SCRUB EZ PLAIN DRY (MISCELLANEOUS) ×4 IMPLANT
CLOTH BEACON ORANGE TIMEOUT ST (SAFETY) ×3 IMPLANT
CORDS BIPOLAR (ELECTRODE) ×3 IMPLANT
COVER MAYO STAND STRL (DRAPES) ×4 IMPLANT
COVER TABLE BACK 60X90 (DRAPES) ×3 IMPLANT
CUFF TOURNIQUET SINGLE 18IN (TOURNIQUET CUFF) ×3 IMPLANT
DECANTER SPIKE VIAL GLASS SM (MISCELLANEOUS) IMPLANT
DRAPE EXTREMITY T 121X128X90 (DRAPE) ×4 IMPLANT
DRAPE SURG 17X23 STRL (DRAPES) ×4 IMPLANT
GAUZE SPONGE 4X4 12PLY STRL LF (GAUZE/BANDAGES/DRESSINGS) ×6 IMPLANT
GAUZE XEROFORM 1X8 LF (GAUZE/BANDAGES/DRESSINGS) ×3 IMPLANT
GLOVE BIO SURGEON STRL SZ 6.5 (GLOVE) ×2 IMPLANT
GLOVE BIOGEL M STRL SZ7.5 (GLOVE) ×2 IMPLANT
GLOVE BIOGEL PI IND STRL 7.0 (GLOVE) IMPLANT
GLOVE BIOGEL PI INDICATOR 7.0 (GLOVE) ×2
GLOVE ORTHO TXT STRL SZ7.5 (GLOVE) ×3 IMPLANT
GOWN PREVENTION PLUS XLARGE (GOWN DISPOSABLE) ×4 IMPLANT
GOWN PREVENTION PLUS XXLARGE (GOWN DISPOSABLE) ×5 IMPLANT
GOWN STRL REIN XL XLG (GOWN DISPOSABLE) ×6 IMPLANT
NEEDLE 27GAX1X1/2 (NEEDLE) IMPLANT
PACK BASIN DAY SURGERY FS (CUSTOM PROCEDURE TRAY) ×3 IMPLANT
PAD CAST 3X4 CTTN HI CHSV (CAST SUPPLIES) ×2 IMPLANT
PAD CAST 4YDX4 CTTN HI CHSV (CAST SUPPLIES) ×2 IMPLANT
PADDING CAST ABS 4INX4YD NS (CAST SUPPLIES) ×1
PADDING CAST ABS COTTON 4X4 ST (CAST SUPPLIES) ×2 IMPLANT
PADDING CAST COTTON 3X4 STRL (CAST SUPPLIES) ×3
PADDING CAST COTTON 4X4 STRL (CAST SUPPLIES) ×6
SPLINT PLASTER CAST XFAST 3X15 (CAST SUPPLIES) ×10 IMPLANT
SPLINT PLASTER XTRA FASTSET 3X (CAST SUPPLIES) ×5
SPONGE GAUZE 4X4 12PLY (GAUZE/BANDAGES/DRESSINGS) ×3 IMPLANT
STOCKINETTE 4X48 STRL (DRAPES) ×4 IMPLANT
STRIP CLOSURE SKIN 1/2X4 (GAUZE/BANDAGES/DRESSINGS) ×3 IMPLANT
SUT PROLENE 3 0 PS 2 (SUTURE) ×3 IMPLANT
SYR 3ML 23GX1 SAFETY (SYRINGE) ×1 IMPLANT
SYR CONTROL 10ML LL (SYRINGE) ×1 IMPLANT
TOWEL OR 17X24 6PK STRL BLUE (TOWEL DISPOSABLE) ×3 IMPLANT
TRAY DSU PREP LF (CUSTOM PROCEDURE TRAY) ×3 IMPLANT
UNDERPAD 30X30 INCONTINENT (UNDERPADS AND DIAPERS) ×3 IMPLANT
WATER STERILE IRR 1000ML POUR (IV SOLUTION) ×3 IMPLANT

## 2012-03-19 NOTE — Anesthesia Postprocedure Evaluation (Signed)
Anesthesia Post Note  Patient: [name]  Procedure(s) Performed: Procedure(s) (LRB): CARPAL TUNNEL RELEASE (Right) RELEASE TRIGGER FINGER/A-1 PULLEY (Left)  Anesthesia type: General  Patient location: PACU  Post pain: Pain level controlled  Post assessment: Patient's Cardiovascular Status Stable  Last Vitals:  Filed Vitals:   03/19/12 1120  BP: 160/74  Pulse: 70  Temp: 36.8 C  Resp: 16    Post vital signs: Reviewed and stable  Level of consciousness: alert  Complications: No apparent anesthesia complications

## 2012-03-19 NOTE — Brief Op Note (Signed)
03/19/2012  9:25 AM  PATIENT:  [name]  36 y.o. female  PRE-OPERATIVE DIAGNOSIS:  right carpal tunnel syndrome, trigger thumb on left  POST-OPERATIVE DIAGNOSIS:  same as preop  PROCEDURE:  Procedure(s) (LRB): CARPAL TUNNEL RELEASE (Right) RELEASE TRIGGER FINGER/A-1 PULLEY LEFT THUMB  SURGEON:  Surgeon(s) and Role:    * Wyn Forster., MD - Primary  PHYSICIAN ASSISTANT:   ASSISTANTS: NURSE  ANESTHESIA:   general  EBL:  Total I/O In: 1000 [I.V.:1000] Out: -   BLOOD ADMINISTERED:none  DRAINS: none   LOCAL MEDICATIONS USED:  LIDOCAINE   SPECIMEN:  No Specimen  DISPOSITION OF SPECIMEN:  N/A  COUNTS:  YES  TOURNIQUET:   Total Tourniquet Time Documented: Upper Arm (Right) - 11 minutes  DICTATION: .Other Dictation: Dictation Number [id number]  PLAN OF CARE: Discharge to home after PACU  PATIENT DISPOSITION:  PACU - hemodynamically stable.

## 2012-03-19 NOTE — Op Note (Signed)
NAMEDAEJAH, KLEBBA              ACCOUNT NO.:  [id number]  MEDICAL RECORD NO.:  [id number]  LOCATION:  [facility]                         FACILITY:  [facility]  PHYSICIAN:  Katy Fitch. Daris Harkins, M.D. DATE OF BIRTH:  Jul 01, 1976  DATE OF PROCEDURE:  03/19/2012 DATE OF DISCHARGE:  03/19/2012                              OPERATIVE REPORT   PREOPERATIVE DIAGNOSES: 1. Entrapment neuropathy, median nerve, right carpal tunnel. 2. Chronic stenosing tenosynovitis, left thumb at A1 pulley.  POSTOPERATIVE DIAGNOSES: 1. Entrapment neuropathy, median nerve, right carpal tunnel. 2. Chronic stenosing tenosynovitis, left thumb at A1 pulley.  OPERATION: 1. Release of right transcarpal ligament 2. Release of left thumb A1 pulley.  OPERATING SURGEON:  Katy Fitch. Mehar Sagen, MD  ASSISTANT:  Surgical assistant.  ANESTHESIA:  General by LMA.  SUPERVISING ANESTHESIOLOGIST:  Janetta Hora. Gelene Mink, MD.  INDICATIONS:  [name] is a 36 year old woman who is referred through the courtesy of [facility] for evaluation of right hand numbness and triggering of her thumbs.  She is status post prior release of her right thumb A1 pulley and has failed nonoperative management of right carpal tunnel syndrome.  She also has triggering of her left thumb.  She requested that we proceed with release of her right transcarpal ligament at this time and release of her left thumb A1 pulley.  Preoperatively she had electrodiagnostic studies that confirmed significant right median neuropathy at wrist level.  After informed consent, she was brought to the operating room at this time.  PROCEDURE:  [name] was brought to room #1 of the [facility] and placed in supine position on the operating table.  Following the induction of general anesthesia by LMA technique, the right arm and hand were prepped with Betadine soap and solution, sterilely draped.  A pneumatic tourniquet was applied to proximal  right brachium.  Following exsanguination of right arm with Esmarch bandage, arterial tourniquet was inflated to 220 mmHg.  A routine surgical time- out was accomplished, which also included our planned procedure on the left thumb.  The right hand procedure commenced with a short incision in the line of the ring finger and the palm.  Subcutaneous tissues were carefully divided revealing the palmar fascia.  This split longitudinally to reveal the common sensory branch of the median nerve. These were followed back to the transcarpal ligament which was gently isolated from the median nerve.  The ligament was then released along its ulnar border extending into the distal forearm.  This widely opened the carpal canal.  No mass or other predicaments were noted.  Bleeding points along the margin of the released ligament were electrocauterized with bipolar current followed by repair of the skin with intradermal 3-0 Prolene suture.  Steri-Strips were applied followed by infiltration of 3 mL of 2% plain lidocaine along the wound margins for postoperative analgesia.  The right hand was then dressed with sterile gauze, sterile Webril, and will be dressed with a volar plaster splint.  The tourniquet was released on the right side with immediate capillary refill to the fingers and thumb.  Attention then directed to the left hand which had been prepped and draped as we were working on the right  side.  IV access was placed at the left antecubital fossa.  Therefore exsanguinated the left hand and forearm with an Esmarch bandage and left the Esmarch on the mid forearm as a arterial tourniquet.  Procedure commenced with a short transverse incision directly over the A1 pulley of the left thumb.  Subcutaneous tissues were carefully divided revealing the radial proper digital nerve.  This was gently retracted in a radial direction to allow visualization of the A1 pulley.  The A1 pulley was incised with  scalpel and scissor.  The scissors were then placed, closed along the pathway of the tendon proximally and distally to assure that there are no other areas of constriction.  The wound was then inspected for bleeding points followed by repair of the skin with intradermal 3-0 Prolene.  A compressive dressing was applied with Steri-Strips, sterile gauze, and Ace wrap.  A 1 mL of lidocaine was infiltrated along the wound margins for postoperative analgesia.  For aftercare, [name] is provided prescription for Percocet 5 mg 1 p.o. q.4-6 hours p.r.n. pain, 20 tablets without refill.     Katy Fitch Yolanda Dockendorf, M.D.     RVS/MEDQ  D:  03/19/2012  T:  03/19/2012  Job:  [id number]  cc:   [facility]
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
## 2012-03-19 NOTE — Op Note (Signed)
[id number] 

## 2012-03-19 NOTE — Anesthesia Procedure Notes (Signed)
Procedure Name: LMA Insertion Date/Time: 03/19/2012 8:52 AM Performed by: Meyer Russel Pre-anesthesia Checklist: Patient identified, Emergency Drugs available, Suction available and Patient being monitored Patient Re-evaluated:Patient Re-evaluated prior to inductionOxygen Delivery Method: Circle System Utilized Preoxygenation: Pre-oxygenation with 100% oxygen Intubation Type: IV induction Ventilation: Mask ventilation without difficulty LMA: LMA inserted LMA Size: 4.0 Number of attempts: 1 Airway Equipment and Method: bite block Placement Confirmation: positive ETCO2 and breath sounds checked- equal and bilateral Tube secured with: Tape Dental Injury: Teeth and Oropharynx as per pre-operative assessment

## 2012-03-19 NOTE — Transfer of Care (Signed)
Immediate Anesthesia Transfer of Care Note  Patient: [name]  Procedure(s) Performed: Procedure(s) (LRB): CARPAL TUNNEL RELEASE (Right) RELEASE TRIGGER FINGER/A-1 PULLEY (Left)  Patient Location: PACU  Anesthesia Type: General  Level of Consciousness: awake and patient cooperative  Airway & Oxygen Therapy: Patient Spontanous Breathing and Patient connected to face mask oxygen  Post-op Assessment: Report given to PACU RN, Post -op Vital signs reviewed and stable and Patient moving all extremities  Post vital signs: Reviewed and stable  Complications: No apparent anesthesia complications

## 2012-03-19 NOTE — Anesthesia Preprocedure Evaluation (Addendum)
Anesthesia Evaluation  Patient identified by MRN, date of birth, ID band Patient awake    Reviewed: Allergy & Precautions, H&P , NPO status , Patient's Chart, lab work & pertinent test results, reviewed documented beta blocker date and time   History of Anesthesia Complications (+) PONV  Airway Mallampati: II TM Distance: >3 FB Neck ROM: full    Dental   Pulmonary asthma ,          Cardiovascular negative cardio ROS      Neuro/Psych  Headaches,  Neuromuscular disease negative psych ROS   GI/Hepatic negative GI ROS, Neg liver ROS,   Endo/Other  negative endocrine ROS  Renal/GU negative Renal ROS  negative genitourinary   Musculoskeletal   Abdominal   Peds  Hematology negative hematology ROS (+)   Anesthesia Other Findings See surgeon's H&P   Reproductive/Obstetrics negative OB ROS                           Anesthesia Physical Anesthesia Plan  ASA: II  Anesthesia Plan: General   Post-op Pain Management:    Induction: Intravenous  Airway Management Planned: LMA  Additional Equipment:   Intra-op Plan:   Post-operative Plan: Extubation in OR  Informed Consent: I have reviewed the patients History and Physical, chart, labs and discussed the procedure including the risks, benefits and alternatives for the proposed anesthesia with the patient or authorized representative who has indicated his/her understanding and acceptance.   Dental Advisory Given  Plan Discussed with: CRNA and Surgeon  Anesthesia Plan Comments:         Anesthesia Quick Evaluation

## 2012-03-19 NOTE — Discharge Instructions (Signed)
HAND SURGERY ° °  HOME CARE INSTRUCTIONS ° ° ° °The following instructions have been prepared to help you care for yourself upon your return home today. ° °Wound Care:  °Keep your hand elevated above the level of your heart. Do not allow it to dangle by your side. Keep the dressing dry and do not remove it unless your doctor advises you to do so. He will usually change it at the time of you post-op visit. Moving your fingers is advised to stimulate circulation but will depend on the site of your surgery. Of course, if you have a splint applied your doctor will advise you about movement. ° °Activity:  °Do not drive or operate machinery today. Rest today and then you may return to your normal activity and work as indicated by your physician. ° °Diet: °Drink liquids today or eat a light diet. You may resume a regular diet tomorrow. ° °General expectations: °Pain for two or three days. °Fingers may become slightly swollen.  ° °Unexpected Observations- Call your doctor if any of these occur: °Severe pain not relieved by pain medication. °Elevated temperature. °Dressing soaked with blood. °Inability to move fingers. °White or bluish color to fingers. ° ° °Post Anesthesia Home Care Instructions ° °Activity: °Get plenty of rest for the remainder of the day. A responsible adult should stay with you for 24 hours following the procedure.  °For the next 24 hours, DO NOT: °-Drive a car °-Operate machinery °-Drink alcoholic beverages °-Take any medication unless instructed by your physician °-Make any legal decisions or sign important papers. ° °Meals: °Start with liquid foods such as gelatin or soup. Progress to regular foods as tolerated. Avoid greasy, spicy, heavy foods. If nausea and/or vomiting occur, drink only clear liquids until the nausea and/or vomiting subsides. Call your physician if vomiting continues. ° °Special Instructions/Symptoms: °Your throat may feel dry or sore from the anesthesia or the breathing tube  placed in your throat during surgery. If this causes discomfort, gargle with warm salt water. The discomfort should disappear within 24 hours. ° ° ° ° °

## 2012-03-20 ENCOUNTER — Encounter (HOSPITAL_BASED_OUTPATIENT_CLINIC_OR_DEPARTMENT_OTHER): Payer: Self-pay | Admitting: Orthopedic Surgery

## 2012-04-28 ENCOUNTER — Ambulatory Visit (INDEPENDENT_AMBULATORY_CARE_PROVIDER_SITE_OTHER): Payer: BC Managed Care – PPO | Admitting: Family Medicine

## 2012-04-28 ENCOUNTER — Encounter: Payer: Self-pay | Admitting: Family Medicine

## 2012-04-28 ENCOUNTER — Other Ambulatory Visit (HOSPITAL_COMMUNITY)
Admission: RE | Admit: 2012-04-28 | Discharge: 2012-04-28 | Disposition: A | Discharge status: Home / Self Care | Payer: BC Managed Care – PPO | Source: Ambulatory Visit | Attending: Family Medicine | Admitting: Family Medicine

## 2012-04-28 VITALS — BP 138/76 | HR 92 | Ht 63.0 in | Wt 179.0 lb

## 2012-04-28 DIAGNOSIS — Z01419 Encounter for gynecological examination (general) (routine) without abnormal findings: Secondary | ICD-10-CM | POA: Insufficient documentation

## 2012-04-28 DIAGNOSIS — Z124 Encounter for screening for malignant neoplasm of cervix: Secondary | ICD-10-CM

## 2012-04-28 DIAGNOSIS — G5600 Carpal tunnel syndrome, unspecified upper limb: Secondary | ICD-10-CM

## 2012-04-28 DIAGNOSIS — Z0000 Encounter for general adult medical examination without abnormal findings: Secondary | ICD-10-CM

## 2012-04-28 NOTE — Progress Notes (Signed)
Patient ID: [name], female   DOB: September 25, 1976, 36 y.o.   MRN: [id number] [name] is a 36 y.o. female who presents to [facility] today for complete physical exam.  The only thing new since the last time I saw here were a trigger finger relief on Left 1st digit and Right carpal tunnel release.  She has had almost total relief from this.  Still with some slight tingling in Left hand from carpal tunnel symptoms, but does better when wearing brace.     The following portions of the patient's history were reviewed and updated as appropriate: allergies, current medications, past medical history, family and social history, and problem list.  Patient is a nonsmoker.   ROS as above otherwise neg. No Chest pain, palpitations, SOB, Fever, Chills, Abd pain, N/V/D.  Medications reviewed. Current Outpatient Prescriptions  Medication Sig Dispense Refill  . albuterol (VENTOLIN HFA) 108 (90 BASE) MCG/ACT inhaler Inhale 2 puffs into the lungs 4 (four) times daily as needed. For cough  1 Inhaler  0  . EPIPEN 2-PAK 0.3 MG/0.3ML DEVI       . fluticasone (FLONASE) 50 MCG/ACT nasal spray Place 2 sprays into the nose daily.      Marland Kitchen ibuprofen (ADVIL,MOTRIN) 200 MG tablet Take 200 mg by mouth every 6 (six) hours as needed.      . loratadine (CLARITIN) 10 MG tablet OTC         Exam:  BP 138/76  Pulse 92  Ht 5\' 3"  (1.6 m)  Wt 179 lb (81.194 kg)  BMI 31.71 kg/m2  LMP 03/12/2012 Gen: Well NAD HEENT:  [place]/AT.  EOMI, PERRL.  MMM, tonsils non-erythematous, non-edematous.  External ears WNL, Bilateral TM's normal without retraction, redness or bulging.  Neck:  Supple, no LAD or thyromegaly.  Lungs: CTABL Nl WOB Cardiac:  Regular rate and rhythm without murmur auscultated.  Good S1/S2. Abd:  Soft/nondistended/nontender.  Good bowel sounds throughout all four quadrants.  No masses noted.  GYN:  External genitalia within normal limits.  Vaginal mucosa pink, moist, normal rugae.  Nonfriable cervix without lesions,  no discharge or bleeding noted on speculum exam.  Pap obtained. Bimanual exam revealed normal, nongravid uterus.  No cervical motion tenderness. No adnexal masses bilaterally.   Exts: Non edematous BL  LE, warm and well perfused.  Psych:  Pleasant, not depressed/anxious appearing.  Linear thought process.  Neuro:  Grossly normal throughout.   No results found for this or any previous visit (from the past 72 hour(s)).

## 2012-04-29 ENCOUNTER — Encounter: Payer: Self-pay | Admitting: Family Medicine

## 2012-04-29 DIAGNOSIS — Z0000 Encounter for general adult medical examination without abnormal findings: Secondary | ICD-10-CM | POA: Insufficient documentation

## 2012-04-29 NOTE — Assessment & Plan Note (Signed)
Patient states she has paperwork from [facility] requiring yearly Pap smears. She last had normal Pap smear last year. Pap performed -- however afterwards I went through her paperwork and she only requires yearly physical, NOT pap. Informed her of this and as long as this one is normal plan to move to updated Pap schedule of Q3 years.

## 2012-04-29 NOTE — Assessment & Plan Note (Signed)
Improved status post release procedure

## 2012-07-20 ENCOUNTER — Emergency Department (HOSPITAL_COMMUNITY): Payer: No Typology Code available for payment source

## 2012-07-20 ENCOUNTER — Emergency Department (HOSPITAL_COMMUNITY)
Admission: EM | Admit: 2012-07-20 | Discharge: 2012-07-21 | Disposition: A | Discharge status: Home / Self Care | Payer: No Typology Code available for payment source | Attending: Emergency Medicine | Admitting: Emergency Medicine

## 2012-07-20 ENCOUNTER — Encounter (HOSPITAL_COMMUNITY): Payer: Self-pay | Admitting: Emergency Medicine

## 2012-07-20 DIAGNOSIS — R10811 Right upper quadrant abdominal tenderness: Secondary | ICD-10-CM | POA: Insufficient documentation

## 2012-07-20 DIAGNOSIS — M79609 Pain in unspecified limb: Secondary | ICD-10-CM | POA: Insufficient documentation

## 2012-07-20 DIAGNOSIS — R51 Headache: Secondary | ICD-10-CM | POA: Insufficient documentation

## 2012-07-20 DIAGNOSIS — M542 Cervicalgia: Secondary | ICD-10-CM | POA: Insufficient documentation

## 2012-07-20 DIAGNOSIS — R0789 Other chest pain: Secondary | ICD-10-CM

## 2012-07-20 DIAGNOSIS — R071 Chest pain on breathing: Secondary | ICD-10-CM | POA: Insufficient documentation

## 2012-07-20 DIAGNOSIS — S139XXA Sprain of joints and ligaments of unspecified parts of neck, initial encounter: Secondary | ICD-10-CM | POA: Insufficient documentation

## 2012-07-20 DIAGNOSIS — S161XXA Strain of muscle, fascia and tendon at neck level, initial encounter: Secondary | ICD-10-CM

## 2012-07-20 DIAGNOSIS — R109 Unspecified abdominal pain: Secondary | ICD-10-CM | POA: Insufficient documentation

## 2012-07-20 DIAGNOSIS — R079 Chest pain, unspecified: Secondary | ICD-10-CM | POA: Insufficient documentation

## 2012-07-20 LAB — BASIC METABOLIC PANEL
CO2: 22 mEq/L (ref 19–32)
Chloride: 105 mEq/L (ref 96–112)
GFR calc Af Amer: >90 mL/min (ref >90)
Potassium: 3.4 mEq/L — ABNORMAL LOW (ref 3.5–5.1)
Sodium: 138 mEq/L (ref 135–145)

## 2012-07-20 LAB — CBC WITH DIFFERENTIAL/PLATELET
Basophils Absolute: 0 10*3/uL (ref 0.0–0.1)
Eosinophils Relative: 2 % (ref 0–5)
HCT: 37.6 % (ref 36.0–46.0)
Hemoglobin: 13.3 g/dL (ref 12.0–15.0)
Lymphocytes Relative: 34 % (ref 12–46)
Lymphs Abs: 3.7 10*3/uL (ref 0.7–4.0)
MCV: 86.4 fL (ref 78.0–100.0)
Monocytes Absolute: 1 10*3/uL (ref 0.1–1.0)
Monocytes Relative: 10 % (ref 3–12)
Neutro Abs: 5.9 10*3/uL (ref 1.7–7.7)
RBC: 4.35 MIL/uL (ref 3.87–5.11)
RDW: 12.1 % (ref 11.5–15.5)
WBC: 10.9 10*3/uL — ABNORMAL HIGH (ref 4.0–10.5)

## 2012-07-20 MED ORDER — SODIUM CHLORIDE 0.9 % IV SOLN
INTRAVENOUS | Status: DC
  Administered 2012-07-21: via INTRAVENOUS

## 2012-07-20 MED ORDER — SODIUM CHLORIDE 0.9 % IV BOLUS (SEPSIS)
1000.0000 mL | Freq: Once | INTRAVENOUS | Status: AC
  Administered 2012-07-20: 1000 mL via INTRAVENOUS

## 2012-07-20 MED ORDER — MORPHINE SULFATE 2 MG/ML IJ SOLN
2.0000 mg | Freq: Once | INTRAMUSCULAR | Status: AC
  Administered 2012-07-20: 2 mg via INTRAVENOUS
  Filled 2012-07-20: qty 1

## 2012-07-20 MED ORDER — ONDANSETRON HCL 4 MG/2ML IJ SOLN
4.0000 mg | Freq: Once | INTRAMUSCULAR | Status: AC
  Administered 2012-07-20: 4 mg via INTRAVENOUS
  Filled 2012-07-20: qty 2

## 2012-07-20 MED ORDER — IOHEXOL 300 MG/ML  SOLN: 100.0000 mL | Freq: Once | INTRAMUSCULAR | Status: AC | PRN

## 2012-07-20 NOTE — ED Notes (Addendum)
Per NP Onalee Hua ok for patient to have small amount of ice chips
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
## 2012-07-20 NOTE — ED Notes (Signed)
Pt back in room from CT 

## 2012-07-20 NOTE — ED Notes (Signed)
Patient transported to CT 
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
## 2012-07-21 MED ORDER — OXYCODONE-ACETAMINOPHEN 5-325 MG PO TABS: 1.0000 | ORAL_TABLET | Freq: Four times a day (QID) | ORAL | Status: DC | PRN

## 2012-07-21 MED ORDER — MORPHINE SULFATE 10 MG/ML IJ SOLN
2.0000 mg | Freq: Once | INTRAMUSCULAR | Status: AC
  Administered 2012-07-21: 2 mg via INTRAVENOUS
  Filled 2012-07-21: qty 1

## 2012-07-21 NOTE — ED Provider Notes (Signed)
Radiology results reviewed and discussed with Dr. Deretha Emory, shared with patient and family.  All exams are essentially normal.  Patient continues to report generalized pain.  Additional analgesia has been ordered by Dr. Deretha Emory.  Patient will be placed in soft collar to provide cervical muscle support.  Incentive spirometer also provided.  Treatment plan discussed with patient, voices understanding.  Patient to follow-up with her PCP if symptoms do not improve over the next several days.  Jimmye Norman, NP 07/21/12 [phone]

## 2012-07-21 NOTE — ED Provider Notes (Signed)
Medical screening examination/treatment/procedure(s) were conducted as a shared visit with non-physician practitioner(s) and myself.  I personally evaluated the patient during the encounter    Shelda Jakes, MD 07/21/12 [phone]

## 2012-12-02 ENCOUNTER — Encounter (HOSPITAL_COMMUNITY): Payer: Self-pay

## 2012-12-02 ENCOUNTER — Emergency Department (INDEPENDENT_AMBULATORY_CARE_PROVIDER_SITE_OTHER)
Admission: EM | Admit: 2012-12-02 | Discharge: 2012-12-02 | Disposition: A | Discharge status: Home / Self Care | Payer: Self-pay | Source: Home / Self Care | Attending: Emergency Medicine | Admitting: Emergency Medicine

## 2012-12-02 ENCOUNTER — Emergency Department (INDEPENDENT_AMBULATORY_CARE_PROVIDER_SITE_OTHER): Payer: BC Managed Care – PPO

## 2012-12-02 DIAGNOSIS — S62639A Displaced fracture of distal phalanx of unspecified finger, initial encounter for closed fracture: Secondary | ICD-10-CM

## 2012-12-02 MED ORDER — OXYCODONE-ACETAMINOPHEN 5-325 MG PO TABS: 1.0000 | ORAL_TABLET | Freq: Four times a day (QID) | ORAL | Status: DC | PRN

## 2012-12-02 MED ORDER — IBUPROFEN 800 MG PO TABS: 800.0000 mg | ORAL_TABLET | Freq: Three times a day (TID) | ORAL | Status: AC | PRN

## 2012-12-02 NOTE — ED Notes (Signed)
Tetanus 2012
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
## 2012-12-02 NOTE — ED Notes (Signed)
Slammed thumb dominant hand in car door earlier this PM

## 2012-12-09 ENCOUNTER — Encounter: Payer: Self-pay | Admitting: Family Medicine

## 2013-01-20 ENCOUNTER — Other Ambulatory Visit: Payer: Self-pay | Admitting: Family Medicine

## 2013-02-01 ENCOUNTER — Other Ambulatory Visit: Payer: Self-pay | Admitting: *Deleted

## 2013-02-01 ENCOUNTER — Other Ambulatory Visit: Payer: Self-pay | Admitting: Family Medicine

## 2013-02-01 MED ORDER — EPINEPHRINE 0.3 MG/0.3ML IJ DEVI: 0.3000 mg | Freq: Once | INTRAMUSCULAR | Status: DC

## 2013-02-09 ENCOUNTER — Other Ambulatory Visit: Payer: Self-pay | Admitting: *Deleted

## 2013-02-09 DIAGNOSIS — J45909 Unspecified asthma, uncomplicated: Secondary | ICD-10-CM

## 2013-02-10 MED ORDER — ALBUTEROL SULFATE HFA 108 (90 BASE) MCG/ACT IN AERS: 2.0000 | INHALATION_SPRAY | Freq: Four times a day (QID) | RESPIRATORY_TRACT | Status: DC | PRN

## 2013-03-23 ENCOUNTER — Encounter: Payer: Self-pay | Admitting: Family Medicine

## 2013-03-26 ENCOUNTER — Ambulatory Visit (INDEPENDENT_AMBULATORY_CARE_PROVIDER_SITE_OTHER): Payer: BC Managed Care – PPO | Admitting: Family Medicine

## 2013-03-26 ENCOUNTER — Encounter: Payer: Self-pay | Admitting: Family Medicine

## 2013-03-26 VITALS — BP 126/76 | HR 78 | Temp 98.3°F | Ht 62.0 in | Wt 188.0 lb

## 2013-03-26 DIAGNOSIS — Z8349 Family history of other endocrine, nutritional and metabolic diseases: Secondary | ICD-10-CM

## 2013-03-26 DIAGNOSIS — R5381 Other malaise: Secondary | ICD-10-CM

## 2013-03-26 DIAGNOSIS — R030 Elevated blood-pressure reading, without diagnosis of hypertension: Secondary | ICD-10-CM

## 2013-03-26 DIAGNOSIS — J45909 Unspecified asthma, uncomplicated: Secondary | ICD-10-CM

## 2013-03-26 LAB — BASIC METABOLIC PANEL
BUN: 14 mg/dL (ref 6–23)
CO2: 24 mEq/L (ref 19–32)
Calcium: 9.1 mg/dL (ref 8.4–10.5)
Glucose, Bld: 101 mg/dL — ABNORMAL HIGH (ref 70–99)
Potassium: 4.1 mEq/L (ref 3.5–5.3)
Sodium: 137 mEq/L (ref 135–145)

## 2013-03-26 LAB — CBC
HCT: 42 % (ref 36.0–46.0)
Hemoglobin: 14 g/dL (ref 12.0–15.0)
MCHC: 33.3 g/dL (ref 30.0–36.0)
MCV: 89.6 fL (ref 78.0–100.0)

## 2013-03-26 MED ORDER — ALBUTEROL SULFATE HFA 108 (90 BASE) MCG/ACT IN AERS: 2.0000 | INHALATION_SPRAY | Freq: Four times a day (QID) | RESPIRATORY_TRACT | Status: DC | PRN

## 2013-03-26 MED ORDER — BECLOMETHASONE DIPROPIONATE 40 MCG/ACT IN AERS: 2.0000 | INHALATION_SPRAY | Freq: Two times a day (BID) | RESPIRATORY_TRACT | Status: DC

## 2013-03-26 MED ORDER — FLUTICASONE PROPIONATE 50 MCG/ACT NA SUSP: NASAL | Status: DC

## 2013-03-26 NOTE — Patient Instructions (Addendum)
Try the Melatonin.  There are lots of dosages, try the lowest dose first (0.5 mg is a good starting dose).   We will check labs and thyroid today.    Take the Qvar once in AM and once in PM.  Use the albuterol when you need it.  Let me know about pricing.

## 2013-03-27 NOTE — Progress Notes (Signed)
.  fmchp Subjective:     [name] is a 37 y.o. female and is here for a comprehensive physical exam. The patient reports problems - fatigue during the day and difficulty sleeping at night. She states that she has a strong family history (sister and mother) with thyroid disease. She also has trouble with sleeping at nighttime. Trouble falling asleep.Marland Kitchen  History   Social History  . Marital Status: Married    Spouse Name: N/A    Number of Children:  2 children. Both in good health   . Years of Education:  graduated from high school.    Occupational History  . Not on file.   Social History Main Topics  . Smoking status: Former Smoker -- 10 years    Quit date: 01/22/1999  . Smokeless tobacco: Never Used  . Alcohol Use: No     Comment: no alcohol in 3 yrs.  . Drug Use: No  . Sexually Active: Yes    Birth Control/ Protection: Surgical   Other Topics Concern  . Not on file   Social History Narrative  . No narrative on file   Health Maintenance  Topic Date Due  . Influenza Vaccine  06/07/2013  . Pap Smear  04/29/2015  . Tetanus/tdap  06/03/2021    The following portions of the patient's history were reviewed and updated as appropriate: allergies, current medications, past family history, past medical history, past social history, past surgical history and problem list.  Review of Systems A comprehensive review of systems was negative.   Objective:    BP 126/76  Pulse 78  Temp(Src) 98.3 F (36.8 C) (Oral)  Ht 5\' 2"  (1.575 m)  Wt 188 lb (85.276 kg)  BMI 34.38 kg/m2  LMP 03/04/2013 General appearance: alert, cooperative, appears stated age and no distress Head: Normocephalic, without obvious abnormality, atraumatic Eyes: conjunctivae/corneas clear. PERRL, EOM's intact. Fundi benign. Ears: normal TM's and external ear canals both ears Nose: Nares normal. Septum midline. Mucosa normal. No drainage or sinus tenderness. Throat: lips, mucosa, and tongue normal; teeth  and gums normal Neck: no adenopathy, supple, symmetrical, trachea midline and thyroid not enlarged, symmetric, no tenderness/mass/nodules Lungs: clear to auscultation bilaterally Heart: regular rate and rhythm, S1, S2 normal, no murmur, click, rub or gallop Abdomen: soft, non-tender; bowel sounds normal; no masses,  no organomegaly Extremities: extremities normal, atraumatic, no cyanosis or edema Pulses: 2+ and symmetric Neurologic: Grossly normal    Assessment:    Healthy female exam. We'll check thyroid studies today. Also checking CBC for anemia. Otherwise she is to return in one year     Plan:     See After Visit Summary for Counseling Recommendations

## 2013-03-30 ENCOUNTER — Telehealth: Payer: Self-pay | Admitting: Family Medicine

## 2013-03-30 NOTE — Telephone Encounter (Signed)
Can you guys call [name] and let her know that her thyroid and iron studies were normal?  I think once we get her sleep schedule under control this will better control her fatigue.  Thanks, Trey Paula

## 2013-03-30 NOTE — Telephone Encounter (Signed)
LMOVM for patient to call our office.  Mylea Roarty, Darlyne Russian, CMA

## 2013-03-30 NOTE — Telephone Encounter (Signed)
Called pt. Informed of message below. Pt verbalized understanding. Pt reports that Qvar is too expensive and she can not afford it. I told the pt to call her insurance company to find out, which inhaler is covered under her formulary and to call us back with the info, so Dr.Walden can change the inhaler for her. Pt agreed. Lorenda Hatchet, Renato Battles

## 2013-03-31 NOTE — Telephone Encounter (Signed)
Think that her finding out what is covered by formulary is best idea.  If she returns call, tell her that pharmacist will not be back in to discuss this utnil next week.  Thanks,  Trey Paula

## 2013-05-27 ENCOUNTER — Encounter: Payer: Self-pay | Admitting: Family Medicine

## 2013-05-27 ENCOUNTER — Ambulatory Visit (INDEPENDENT_AMBULATORY_CARE_PROVIDER_SITE_OTHER): Payer: BC Managed Care – PPO | Admitting: Family Medicine

## 2013-05-27 VITALS — BP 110/70 | HR 93 | Temp 98.5°F | Ht 62.0 in | Wt 189.0 lb

## 2013-05-27 DIAGNOSIS — R1011 Right upper quadrant pain: Secondary | ICD-10-CM

## 2013-05-27 LAB — COMPREHENSIVE METABOLIC PANEL
Alkaline Phosphatase: 104 U/L (ref 39–117)
CO2: 23 mEq/L (ref 19–32)
Creat: 0.86 mg/dL (ref 0.50–1.10)
Glucose, Bld: 88 mg/dL (ref 70–99)
Total Bilirubin: 0.3 mg/dL (ref 0.3–1.2)

## 2013-05-27 LAB — CBC
HCT: 39.5 % (ref 36.0–46.0)
MCH: 30.4 pg (ref 26.0–34.0)
MCV: 88.4 fL (ref 78.0–100.0)
RBC: 4.47 MIL/uL (ref 3.87–5.11)
WBC: 9.7 10*3/uL (ref 4.0–10.5)
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
## 2013-05-27 NOTE — Patient Instructions (Signed)
We will check your liver and gallbladder tests today.  We will get you set up for ultrasound too.   I will call you with the results.

## 2013-05-28 DIAGNOSIS — R1011 Right upper quadrant pain: Secondary | ICD-10-CM | POA: Insufficient documentation

## 2013-05-28 NOTE — Assessment & Plan Note (Signed)
Typical gallstone pain by history and exam. Checking labs today.   Sending for [place]. No red flags.  Patient not thrilled about surgery for improvement.  Wants to consider continuing baked/non fatty foods.   Will call with [place] results.  Discussed liklihood of surgery as sounds like her pain is worsening in past month.  Warnings and counseling regarding potential for cholecystitis and complications provided.

## 2013-05-31 ENCOUNTER — Ambulatory Visit (HOSPITAL_COMMUNITY)
Admission: RE | Admit: 2013-05-31 | Discharge: 2013-05-31 | Disposition: A | Discharge status: Home / Self Care | Payer: BC Managed Care – PPO | Source: Ambulatory Visit | Attending: Family Medicine | Admitting: Family Medicine

## 2013-05-31 DIAGNOSIS — R1011 Right upper quadrant pain: Secondary | ICD-10-CM | POA: Insufficient documentation

## 2013-06-02 ENCOUNTER — Telehealth: Payer: Self-pay | Admitting: Family Medicine

## 2013-06-02 DIAGNOSIS — K8020 Calculus of gallbladder without cholecystitis without obstruction: Secondary | ICD-10-CM

## 2013-06-02 NOTE — Telephone Encounter (Signed)
Called and discussed results of [place] with patient.  Polyp vs stone.  She is continuing to have episodes of what sound to be classic gallstone "attacks."  She would like referral to surgery to discuss her options.  Will place this today.

## 2013-06-04 ENCOUNTER — Telehealth: Payer: Self-pay | Admitting: Family Medicine

## 2013-06-04 NOTE — Telephone Encounter (Signed)
Patient is calling to check the status of the referral for the removal of her Gall bladder.  She was to hear something by the end of the day Wednesday and it is now Friday.

## 2013-06-04 NOTE — Telephone Encounter (Signed)
Appointment is for Monday 9/8 @ 9:45am. Patient needs to be at their office at 9:20am this is at [facility]  Address: [address] [place], [place] [address]  [phone]

## 2013-06-04 NOTE — Telephone Encounter (Signed)
Attempted to call patient. She has just stepped out for lunch. Patient to be reached after lunch

## 2013-06-04 NOTE — Telephone Encounter (Signed)
Spoke with patient and informed her of her appintment

## 2013-06-14 ENCOUNTER — Encounter (INDEPENDENT_AMBULATORY_CARE_PROVIDER_SITE_OTHER): Payer: Self-pay | Admitting: Surgery

## 2013-06-14 ENCOUNTER — Telehealth: Payer: Self-pay | Admitting: Family Medicine

## 2013-06-14 ENCOUNTER — Telehealth (INDEPENDENT_AMBULATORY_CARE_PROVIDER_SITE_OTHER): Payer: Self-pay | Admitting: Surgery

## 2013-06-14 ENCOUNTER — Ambulatory Visit (INDEPENDENT_AMBULATORY_CARE_PROVIDER_SITE_OTHER): Payer: BC Managed Care – PPO | Admitting: Surgery

## 2013-06-14 VITALS — BP 110/68 | HR 78 | Temp 98.3°F | Resp 14 | Ht 63.0 in | Wt 189.6 lb

## 2013-06-14 DIAGNOSIS — K8020 Calculus of gallbladder without cholecystitis without obstruction: Secondary | ICD-10-CM

## 2013-06-14 DIAGNOSIS — K8050 Calculus of bile duct without cholangitis or cholecystitis without obstruction: Secondary | ICD-10-CM

## 2013-06-14 DIAGNOSIS — K8021 Calculus of gallbladder without cholecystitis with obstruction: Secondary | ICD-10-CM
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
## 2013-06-14 NOTE — Patient Instructions (Signed)

## 2013-06-14 NOTE — Telephone Encounter (Signed)
Pt made aware of financial obligation will call back to schedule

## 2013-06-16 NOTE — Telephone Encounter (Signed)
error 

## 2013-07-07 DIAGNOSIS — M654 Radial styloid tenosynovitis [de Quervain]: Secondary | ICD-10-CM

## 2013-07-07 HISTORY — DX: Radial styloid tenosynovitis (de quervain): M65.4

## 2013-07-29 ENCOUNTER — Encounter (HOSPITAL_BASED_OUTPATIENT_CLINIC_OR_DEPARTMENT_OTHER): Payer: Self-pay | Admitting: *Deleted

## 2013-08-03 ENCOUNTER — Other Ambulatory Visit: Payer: Self-pay | Admitting: Orthopedic Surgery
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
## 2013-08-05 ENCOUNTER — Ambulatory Visit (HOSPITAL_BASED_OUTPATIENT_CLINIC_OR_DEPARTMENT_OTHER)
Admission: RE | Admit: 2013-08-05 | Discharge: 2013-08-05 | Disposition: A | Discharge status: Home / Self Care | Payer: Worker's Compensation | Source: Ambulatory Visit | Attending: Orthopedic Surgery | Admitting: Orthopedic Surgery

## 2013-08-05 ENCOUNTER — Encounter (HOSPITAL_BASED_OUTPATIENT_CLINIC_OR_DEPARTMENT_OTHER): Payer: Worker's Compensation | Admitting: Anesthesiology

## 2013-08-05 ENCOUNTER — Encounter (HOSPITAL_BASED_OUTPATIENT_CLINIC_OR_DEPARTMENT_OTHER)
Admission: RE | Disposition: A | Discharge status: Home / Self Care | Payer: Self-pay | Source: Ambulatory Visit | Attending: Orthopedic Surgery

## 2013-08-05 ENCOUNTER — Ambulatory Visit (HOSPITAL_BASED_OUTPATIENT_CLINIC_OR_DEPARTMENT_OTHER): Payer: Worker's Compensation | Admitting: Anesthesiology

## 2013-08-05 ENCOUNTER — Encounter (HOSPITAL_BASED_OUTPATIENT_CLINIC_OR_DEPARTMENT_OTHER): Payer: Self-pay

## 2013-08-05 DIAGNOSIS — M654 Radial styloid tenosynovitis [de Quervain]: Secondary | ICD-10-CM | POA: Insufficient documentation

## 2013-08-05 DIAGNOSIS — J45909 Unspecified asthma, uncomplicated: Secondary | ICD-10-CM | POA: Insufficient documentation

## 2013-08-05 HISTORY — DX: Personal history of other specified conditions: Z87.898

## 2013-08-05 HISTORY — DX: Radial styloid tenosynovitis (de quervain): M65.4

## 2013-08-05 HISTORY — DX: Family history of other specified conditions: Z84.89

## 2013-08-05 HISTORY — PX: DORSAL COMPARTMENT RELEASE: SHX5039

## 2013-08-05 LAB — POCT HEMOGLOBIN-HEMACUE: Hemoglobin: 14.8 g/dL (ref 12.0–15.0)

## 2013-08-05 SURGERY — RELEASE, FIRST DORSAL COMPARTMENT, HAND
Anesthesia: General | Laterality: Right | Wound class: Clean

## 2013-08-05 MED ORDER — SODIUM CHLORIDE 0.9 % IJ SOLN
INTRAMUSCULAR | Status: AC
  Filled 2013-08-05: qty 10

## 2013-08-05 MED ORDER — FENTANYL CITRATE 0.05 MG/ML IJ SOLN
INTRAMUSCULAR | Status: DC | PRN
  Administered 2013-08-05: 50 ug via INTRAVENOUS

## 2013-08-05 MED ORDER — LIDOCAINE HCL 2 % IJ SOLN
INTRAMUSCULAR | Status: DC | PRN
  Administered 2013-08-05: 3 mL

## 2013-08-05 MED ORDER — PROMETHAZINE HCL 25 MG/ML IJ SOLN
6.2500 mg | INTRAMUSCULAR | Status: DC | PRN
  Administered 2013-08-05: 6.25 mg via INTRAVENOUS

## 2013-08-05 MED ORDER — CEFAZOLIN SODIUM-DEXTROSE 2-3 GM-% IV SOLR
INTRAVENOUS | Status: AC
  Filled 2013-08-05: qty 50

## 2013-08-05 MED ORDER — LACTATED RINGERS IV SOLN
INTRAVENOUS | Status: DC
  Administered 2013-08-05: 20 mL/h via INTRAVENOUS
  Administered 2013-08-05: 10:00:00 via INTRAVENOUS

## 2013-08-05 MED ORDER — FENTANYL CITRATE 0.05 MG/ML IJ SOLN: 50.0000 ug | INTRAMUSCULAR | Status: DC | PRN

## 2013-08-05 MED ORDER — PROPOFOL 10 MG/ML IV BOLUS
INTRAVENOUS | Status: AC
  Filled 2013-08-05: qty 20

## 2013-08-05 MED ORDER — OXYCODONE HCL 5 MG PO TABS
5.0000 mg | ORAL_TABLET | Freq: Once | ORAL | Status: AC | PRN
  Administered 2013-08-05: 5 mg via ORAL

## 2013-08-05 MED ORDER — FENTANYL CITRATE 0.05 MG/ML IJ SOLN: 25.0000 ug | INTRAMUSCULAR | Status: DC | PRN

## 2013-08-05 MED ORDER — OXYCODONE HCL 5 MG/5ML PO SOLN: 5.0000 mg | Freq: Once | ORAL | Status: AC | PRN

## 2013-08-05 MED ORDER — OXYCODONE HCL 5 MG PO TABS
ORAL_TABLET | ORAL | Status: AC
  Filled 2013-08-05: qty 1

## 2013-08-05 MED ORDER — DEXTROSE 5 % IV SOLN
3.0000 g | INTRAVENOUS | Status: AC
  Administered 2013-08-05: 3 g via INTRAVENOUS

## 2013-08-05 MED ORDER — MIDAZOLAM HCL 2 MG/2ML IJ SOLN
INTRAMUSCULAR | Status: AC
  Filled 2013-08-05: qty 2

## 2013-08-05 MED ORDER — LIDOCAINE HCL (PF) 1 % IJ SOLN
INTRAMUSCULAR | Status: AC
  Filled 2013-08-05: qty 5

## 2013-08-05 MED ORDER — TRAMADOL HCL 50 MG PO TABS: 50.0000 mg | ORAL_TABLET | Freq: Four times a day (QID) | ORAL | Status: DC | PRN

## 2013-08-05 MED ORDER — LIDOCAINE HCL 2 % IJ SOLN
INTRAMUSCULAR | Status: AC
  Filled 2013-08-05: qty 20

## 2013-08-05 MED ORDER — PROMETHAZINE HCL 25 MG/ML IJ SOLN
INTRAMUSCULAR | Status: AC
  Filled 2013-08-05: qty 1

## 2013-08-05 MED ORDER — MIDAZOLAM HCL 5 MG/5ML IJ SOLN
INTRAMUSCULAR | Status: DC | PRN
  Administered 2013-08-05: 2 mg via INTRAVENOUS

## 2013-08-05 MED ORDER — PROPOFOL INFUSION 10 MG/ML OPTIME
INTRAVENOUS | Status: DC | PRN
  Administered 2013-08-05: 100 ug/kg/min via INTRAVENOUS

## 2013-08-05 MED ORDER — MIDAZOLAM HCL 2 MG/ML PO SYRP: 12.0000 mg | ORAL_SOLUTION | Freq: Once | ORAL | Status: DC | PRN

## 2013-08-05 MED ORDER — ONDANSETRON HCL 4 MG/2ML IJ SOLN
INTRAMUSCULAR | Status: DC | PRN
  Administered 2013-08-05: 4 mg via INTRAVENOUS

## 2013-08-05 MED ORDER — CHLORHEXIDINE GLUCONATE 4 % EX LIQD: 1.0000 "application " | Freq: Once | CUTANEOUS | Status: DC

## 2013-08-05 MED ORDER — LIDOCAINE HCL (CARDIAC) 20 MG/ML IV SOLN
INTRAVENOUS | Status: DC | PRN
  Administered 2013-08-05: 50 mg via INTRAVENOUS

## 2013-08-05 MED ORDER — FENTANYL CITRATE 0.05 MG/ML IJ SOLN
INTRAMUSCULAR | Status: AC
  Filled 2013-08-05: qty 2

## 2013-08-05 MED ORDER — CHLORHEXIDINE GLUCONATE 4 % EX LIQD: 60.0000 mL | Freq: Once | CUTANEOUS | Status: DC

## 2013-08-05 MED ORDER — MIDAZOLAM HCL 2 MG/2ML IJ SOLN: 1.0000 mg | INTRAMUSCULAR | Status: DC | PRN

## 2013-08-05 MED ORDER — METHYLPREDNISOLONE ACETATE 40 MG/ML IJ SUSP
INTRAMUSCULAR | Status: AC
  Filled 2013-08-05: qty 1

## 2013-08-05 SURGICAL SUPPLY — 41 items
BANDAGE ELASTIC 3 VELCRO ST LF (GAUZE/BANDAGES/DRESSINGS) ×2 IMPLANT
BLADE MINI RND TIP GREEN BEAV (BLADE) IMPLANT
BLADE SURG 15 STRL LF DISP TIS (BLADE) ×1 IMPLANT
BLADE SURG 15 STRL SS (BLADE) ×2
BNDG CMPR 9X4 STRL LF SNTH (GAUZE/BANDAGES/DRESSINGS) ×1
BNDG COHESIVE 3X5 TAN STRL LF (GAUZE/BANDAGES/DRESSINGS) IMPLANT
BNDG ESMARK 4X9 LF (GAUZE/BANDAGES/DRESSINGS) ×1 IMPLANT
BRUSH SCRUB EZ PLAIN DRY (MISCELLANEOUS) ×2 IMPLANT
CORDS BIPOLAR (ELECTRODE) IMPLANT
COVER MAYO STAND STRL (DRAPES) ×2 IMPLANT
COVER TABLE BACK 60X90 (DRAPES) ×2 IMPLANT
CUFF TOURNIQUET SINGLE 18IN (TOURNIQUET CUFF) IMPLANT
DECANTER SPIKE VIAL GLASS SM (MISCELLANEOUS) IMPLANT
DRAPE EXTREMITY T 121X128X90 (DRAPE) ×2 IMPLANT
DRAPE SURG 17X23 STRL (DRAPES) ×2 IMPLANT
DRSG TEGADERM 4X4.75 (GAUZE/BANDAGES/DRESSINGS) IMPLANT
GLOVE BIOGEL M STRL SZ7.5 (GLOVE) ×1 IMPLANT
GLOVE BIOGEL PI IND STRL 7.0 (GLOVE) IMPLANT
GLOVE BIOGEL PI INDICATOR 7.0 (GLOVE) ×2
GLOVE ECLIPSE 6.5 STRL STRAW (GLOVE) ×1 IMPLANT
GLOVE ORTHO TXT STRL SZ7.5 (GLOVE) ×2 IMPLANT
GOWN BRE IMP PREV XXLGXLNG (GOWN DISPOSABLE) ×3 IMPLANT
GOWN PREVENTION PLUS XLARGE (GOWN DISPOSABLE) ×2 IMPLANT
NEEDLE 27GAX1X1/2 (NEEDLE) ×1 IMPLANT
PACK BASIN DAY SURGERY FS (CUSTOM PROCEDURE TRAY) ×2 IMPLANT
PAD CAST 3X4 CTTN HI CHSV (CAST SUPPLIES) IMPLANT
PADDING CAST ABS 4INX4YD NS (CAST SUPPLIES)
PADDING CAST ABS COTTON 4X4 ST (CAST SUPPLIES) ×1 IMPLANT
PADDING CAST COTTON 3X4 STRL (CAST SUPPLIES)
SLEEVE SCD COMPRESS KNEE MED (MISCELLANEOUS) IMPLANT
SPONGE GAUZE 4X4 12PLY (GAUZE/BANDAGES/DRESSINGS) ×2 IMPLANT
STOCKINETTE 4X48 STRL (DRAPES) ×2 IMPLANT
STRIP CLOSURE SKIN 1/2X4 (GAUZE/BANDAGES/DRESSINGS) ×2 IMPLANT
SUT PROLENE 3 0 PS 2 (SUTURE) ×2 IMPLANT
SUT PROLENE 4 0 P 3 18 (SUTURE) ×1 IMPLANT
SUT VIC AB 4-0 P-3 18XBRD (SUTURE) IMPLANT
SUT VIC AB 4-0 P3 18 (SUTURE)
SYR 3ML 23GX1 SAFETY (SYRINGE) IMPLANT
SYR CONTROL 10ML LL (SYRINGE) ×1 IMPLANT
TRAY DSU PREP LF (CUSTOM PROCEDURE TRAY) ×2 IMPLANT
UNDERPAD 30X30 INCONTINENT (UNDERPADS AND DIAPERS) ×2 IMPLANT

## 2013-08-05 NOTE — Transfer of Care (Signed)
Immediate Anesthesia Transfer of Care Note  Patient: [name]  Procedure(s) Performed: Procedure(s): RELEASE RIGHT FIRST DORSAL COMPARTMENT (DEQUERVAIN) (Right)  Patient Location: PACU  Anesthesia Type:MAC  Level of Consciousness: sedated and patient cooperative  Airway & Oxygen Therapy: Patient Spontanous Breathing and Patient connected to face mask oxygen  Post-op Assessment: Report given to PACU RN and Post -op Vital signs reviewed and stable  Post vital signs: Reviewed and stable  Complications: No apparent anesthesia complications

## 2013-08-05 NOTE — Anesthesia Preprocedure Evaluation (Addendum)
Anesthesia Evaluation  Patient identified by MRN, date of birth, ID band Patient awake    Reviewed: Allergy & Precautions, H&P , NPO status , Patient's Chart, lab work & pertinent test results, reviewed documented beta blocker date and time   History of Anesthesia Complications (+) PONV and history of anesthetic complications  Airway Mallampati: I TM Distance: >3 FB Neck ROM: full    Dental  (+) Teeth Intact   Pulmonary asthma ,  breath sounds clear to auscultation        Cardiovascular negative cardio ROS  Rhythm:Regular Rate:Normal     Neuro/Psych  Headaches,  Neuromuscular disease negative psych ROS   GI/Hepatic negative GI ROS, Neg liver ROS,   Endo/Other  negative endocrine ROS  Renal/GU      Musculoskeletal   Abdominal   Peds  Hematology negative hematology ROS (+)   Anesthesia Other Findings See surgeon's H&P   Reproductive/Obstetrics negative OB ROS                          Anesthesia Physical Anesthesia Plan  ASA: II  Anesthesia Plan: General   Post-op Pain Management:    Induction: Intravenous  Airway Management Planned: LMA  Additional Equipment:   Intra-op Plan:   Post-operative Plan: Extubation in OR  Informed Consent: I have reviewed the patients History and Physical, chart, labs and discussed the procedure including the risks, benefits and alternatives for the proposed anesthesia with the patient or authorized representative who has indicated his/her understanding and acceptance.   Dental advisory given  Plan Discussed with:   Anesthesia Plan Comments:         Anesthesia Quick Evaluation

## 2013-08-05 NOTE — Brief Op Note (Signed)
08/05/2013  10:53 AM  PATIENT:  [name]  37 y.o. female  PRE-OPERATIVE DIAGNOSIS:  Right Stenosing tenosynovitis 1st dorsal compartment  POST-OPERATIVE DIAGNOSIS:  Right Stenosing tenosynovitis 1st dorsal compartment  PROCEDURE:  Procedure(s): RELEASE RIGHT FIRST DORSAL COMPARTMENT (DEQUERVAIN) (Right)  SURGEON:  Surgeon(s) and Role:    * Wyn Forster., MD - Primary  PHYSICIAN ASSISTANT:   ASSISTANTS: surgical technician  ANESTHESIA:   MAC  EBL:  Total I/O In: 400 [I.V.:400] Out: -   BLOOD ADMINISTERED:none  DRAINS: none   LOCAL MEDICATIONS USED:  LIDOCAINE   SPECIMEN:  No Specimen  DISPOSITION OF SPECIMEN:  N/A  COUNTS:  YES  TOURNIQUET:  * Missing tourniquet times found for documented tourniquets in log:  [id number] *  DICTATION: .Other Dictation: Dictation Number [id number]  PLAN OF CARE: Discharge to home after PACU  PATIENT DISPOSITION:  PACU - hemodynamically stable.   Delay start of Pharmacological VTE agent (>24hrs) due to surgical blood loss or risk of bleeding: not applicable

## 2013-08-05 NOTE — Op Note (Signed)
[id number] 

## 2013-08-05 NOTE — Anesthesia Postprocedure Evaluation (Signed)
  Anesthesia Post-op Note  Patient: [name]  Procedure(s) Performed: Procedure(s): RELEASE RIGHT FIRST DORSAL COMPARTMENT (DEQUERVAIN) (Right)  Patient Location: PACU  Anesthesia Type:MAC  Level of Consciousness: awake  Airway and Oxygen Therapy: Patient Spontanous Breathing  Post-op Pain: mild  Post-op Assessment: Post-op Vital signs reviewed  Post-op Vital Signs: stable  Complications: No apparent anesthesia complications

## 2013-08-06 ENCOUNTER — Encounter (HOSPITAL_BASED_OUTPATIENT_CLINIC_OR_DEPARTMENT_OTHER): Payer: Self-pay | Admitting: Orthopedic Surgery

## 2013-08-06 NOTE — Op Note (Signed)
[name], [name]              ACCOUNT NO.:  [id number]  MEDICAL RECORD NO.:  [id number]  LOCATION:                                 FACILITY:  PHYSICIAN:  Katy Fitch. Trenice Mesa, M.D. DATE OF BIRTH:  01/23/1976  DATE OF PROCEDURE:  08/05/2013 DATE OF DISCHARGE:  08/05/2013                              OPERATIVE REPORT   PREOPERATIVE DIAGNOSIS:  Chronic stenosing tenosynovitis, right first dorsal compartment.  POSTOPERATIVE DIAGNOSIS:  Chronic stenosing tenosynovitis, right first dorsal compartment with identification of separate dorsal compartment for extensor pollicis brevis.  OPERATION:  Release of right first dorsal compartment with removal of septum between extensor pollicis brevis and abductor pollicis longus tendon slips.  OPERATING SURGEON:  Katy Fitch. Mariska Daffin, M.D.  ASSISTANT:  Surgical technician.  ANESTHESIA:  2% lidocaine field block supplemented by IV sedation.  SUPERVISING ANESTHESIOLOGIST:  Burna Forts, M.D.  INDICATIONS:  [name] is a 37 year old woman employed by a [employer].  She has had a number of orthopedic predicaments related to her highly repetitive work.  We initially met [name] when she had carpal tunnel syndrome and a trigger thumb.  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  Unfortunately, [name]'s wrist impairment improved over time with prolonged splinting and rest.  During the past 3 months, she has had a radial- sided wrist pain consistent with Lollie Sails stenosing tenosynovitis.  We have treated her with steroid injections and splinting for a lengthy period of time without permanent relief.  We advised her to proceed at this time with release of her first dorsal compartment.  We anticipated finding a septum sequestering the extensor pollicis brevis based on the triggering phenomenon noted with motion of her thumb MP  joint.  After informed consent, she is brought to the operating room at this time.  PROCEDURE:  Sheron Fayad was brought to room 2 of the [facility] and placed in supine position upon the operating table.  Following the induction of IV sedation under Dr. Marlane Mingle supervision, the right hand and arm were prepped with Betadine soap and solution, sterilely draped.  A 2% lidocaine was infiltrated in the path of the intended incision of the first dorsal compartment.  After a few moments, excellent anesthesia was achieved.  The arm was then draped with sterile stockinette and impervious arthroscopy drapes, followed by exsanguination with an Esmarch bandage and inflation of the arterial tourniquet on the proximal right brachium to 225 mmHg. Following routine surgical time-out, procedure commenced with a short transverse incision directly over the palpably thickened first dorsal compartment.  Subcutaneous tissues were carefully divided, taking care to identify and gently retract the radial superficial sensory branches. The compartment was incised from the base of the thumb metacarpal to the musculotendinous junction.  There were 2 slips of the abductor pollicis longus which appeared normal.  There was a completely separate compartment for the extensor pollicis brevis that was identified and released and a septum removed.  I took care to follow the extensor pollicis brevis beyond the base of the thumb metacarpal to be certain that there was not a  stenotic site leading to her triggering phenomenon.  After removal of the septum with a micro rongeur, the wound was repaired with intradermal 4-0 Prolene and Steri-Strips.  For aftercare, Ms. Handy was placed in dressing of sterile gauze, Tegaderm, and Coban.  There were no apparent complications.     Katy Fitch Lenix Butler, M.D.     RVS/MEDQ  D:  08/05/2013  T:  08/06/2013  Job:  [id number]

## 2013-08-12 ENCOUNTER — Other Ambulatory Visit: Payer: Self-pay

## 2014-05-01 ENCOUNTER — Emergency Department (HOSPITAL_COMMUNITY): Payer: Medicaid Other

## 2014-05-01 ENCOUNTER — Inpatient Hospital Stay (HOSPITAL_COMMUNITY)
Admission: EM | Admit: 2014-05-01 | Discharge: 2014-05-02 | DRG: 917 | Disposition: A | Discharge status: Home / Self Care | Payer: Medicaid Other | Attending: Pulmonary Disease | Admitting: Pulmonary Disease

## 2014-05-01 ENCOUNTER — Encounter (HOSPITAL_COMMUNITY): Payer: Self-pay | Admitting: Emergency Medicine

## 2014-05-01 DIAGNOSIS — Z885 Allergy status to narcotic agent status: Secondary | ICD-10-CM

## 2014-05-01 DIAGNOSIS — T6391XA Toxic effect of contact with unspecified venomous animal, accidental (unintentional), initial encounter: Secondary | ICD-10-CM | POA: Diagnosis present

## 2014-05-01 DIAGNOSIS — Z87891 Personal history of nicotine dependence: Secondary | ICD-10-CM

## 2014-05-01 DIAGNOSIS — T782XXA Anaphylactic shock, unspecified, initial encounter: Secondary | ICD-10-CM | POA: Diagnosis present

## 2014-05-01 DIAGNOSIS — R061 Stridor: Secondary | ICD-10-CM

## 2014-05-01 DIAGNOSIS — T63461A Toxic effect of venom of wasps, accidental (unintentional), initial encounter: Secondary | ICD-10-CM | POA: Diagnosis present

## 2014-05-01 DIAGNOSIS — J9600 Acute respiratory failure, unspecified whether with hypoxia or hypercapnia: Secondary | ICD-10-CM | POA: Diagnosis present

## 2014-05-01 DIAGNOSIS — Z91018 Allergy to other foods: Secondary | ICD-10-CM

## 2014-05-01 DIAGNOSIS — T63443S Toxic effect of venom of bees, assault, sequela: Secondary | ICD-10-CM

## 2014-05-01 DIAGNOSIS — J45901 Unspecified asthma with (acute) exacerbation: Secondary | ICD-10-CM | POA: Diagnosis present

## 2014-05-01 HISTORY — DX: Transient cerebral ischemic attack, unspecified: G45.9

## 2014-05-01 LAB — BLOOD GAS, ARTERIAL
Acid-base deficit: 8.6 mmol/L — ABNORMAL HIGH (ref 0.0–2.0)
BICARBONATE: 15.4 meq/L — AB (ref 20.0–24.0)
Drawn by: 27407
FIO2: 0.21 %
O2 SAT: 97.4 %
Patient temperature: 98.6
TCO2: 16.2 mmol/L (ref 0–100)
pCO2 arterial: 26 mmHg — ABNORMAL LOW (ref 35.0–45.0)
pH, Arterial: 7.39 (ref 7.350–7.450)
pO2, Arterial: 92.1 mmHg (ref 80.0–100.0)

## 2014-05-01 LAB — CBC WITH DIFFERENTIAL/PLATELET
BASOS PCT: 0 % (ref 0–1)
Basophils Absolute: 0 10*3/uL (ref 0.0–0.1)
Eosinophils Absolute: 0.4 10*3/uL (ref 0.0–0.7)
Eosinophils Relative: 2 % (ref 0–5)
HCT: 40.4 % (ref 36.0–46.0)
HEMOGLOBIN: 13.7 g/dL (ref 12.0–15.0)
LYMPHS PCT: 36 % (ref 12–46)
Lymphs Abs: 5.7 10*3/uL — ABNORMAL HIGH (ref 0.7–4.0)
MCH: 30.3 pg (ref 26.0–34.0)
MCHC: 33.9 g/dL (ref 30.0–36.0)
MCV: 89.4 fL (ref 78.0–100.0)
MONOS PCT: 7 % (ref 3–12)
Monocytes Absolute: 1.1 10*3/uL — ABNORMAL HIGH (ref 0.1–1.0)
NEUTROS PCT: 55 % (ref 43–77)
Neutro Abs: 8.5 10*3/uL — ABNORMAL HIGH (ref 1.7–7.7)
Platelets: 308 10*3/uL (ref 150–400)
RBC: 4.52 MIL/uL (ref 3.87–5.11)
RDW: 12.3 % (ref 11.5–15.5)
WBC: 15.6 10*3/uL — AB (ref 4.0–10.5)

## 2014-05-01 LAB — COMPREHENSIVE METABOLIC PANEL
ALBUMIN: 3.8 g/dL (ref 3.5–5.2)
ALT: 17 U/L (ref 0–35)
AST: 16 U/L (ref 0–37)
Alkaline Phosphatase: 114 U/L (ref 39–117)
Anion gap: 19 — ABNORMAL HIGH (ref 5–15)
BILIRUBIN TOTAL: 0.3 mg/dL (ref 0.3–1.2)
BUN: 16 mg/dL (ref 6–23)
CO2: 19 mEq/L (ref 19–32)
Calcium: 8.7 mg/dL (ref 8.4–10.5)
Chloride: 102 mEq/L (ref 96–112)
Creatinine, Ser: 0.64 mg/dL (ref 0.50–1.10)
GFR calc Af Amer: >90 mL/min (ref >90)
GFR calc non Af Amer: >90 mL/min (ref >90)
Glucose, Bld: 181 mg/dL — ABNORMAL HIGH (ref 70–99)
Potassium: 2.9 mEq/L — CL (ref 3.7–5.3)
Sodium: 140 mEq/L (ref 137–147)
Total Protein: 6.8 g/dL (ref 6.0–8.3)

## 2014-05-01 LAB — I-STAT ARTERIAL BLOOD GAS, ED
ACID-BASE DEFICIT: 7 mmol/L — AB (ref 0.0–2.0)
Bicarbonate: 18.1 mEq/L — ABNORMAL LOW (ref 20.0–24.0)
O2 SAT: 95 %
Patient temperature: 98.6
TCO2: 19 mmol/L (ref 0–100)
pCO2 arterial: 33.2 mmHg — ABNORMAL LOW (ref 35.0–45.0)
pH, Arterial: 7.345 — ABNORMAL LOW (ref 7.350–7.450)
pO2, Arterial: 77 mmHg — ABNORMAL LOW (ref 80.0–100.0)

## 2014-05-01 LAB — GLUCOSE, CAPILLARY
Glucose-Capillary: 161 mg/dL — ABNORMAL HIGH (ref 70–99)
Glucose-Capillary: 160 mg/dL — ABNORMAL HIGH (ref 70–99)

## 2014-05-01 LAB — TROPONIN I: Troponin I: <0.3 ng/mL (ref <0.30)

## 2014-05-01 LAB — MRSA PCR SCREENING: MRSA BY PCR: NEGATIVE

## 2014-05-01 MED ORDER — RACEPINEPHRINE HCL 2.25 % IN NEBU
0.5000 mL | INHALATION_SOLUTION | Freq: Once | RESPIRATORY_TRACT | Status: AC
  Administered 2014-05-01: 0.5 mL via RESPIRATORY_TRACT
  Filled 2014-05-01: qty 0.5

## 2014-05-01 MED ORDER — POTASSIUM CHLORIDE CRYS ER 20 MEQ PO TBCR
40.0000 meq | EXTENDED_RELEASE_TABLET | ORAL | Status: AC
  Administered 2014-05-01 (×2): 40 meq via ORAL
  Filled 2014-05-01 (×2): qty 2

## 2014-05-01 MED ORDER — METHYLPREDNISOLONE SODIUM SUCC 125 MG IJ SOLR: 80.0000 mg | Freq: Two times a day (BID) | INTRAMUSCULAR | Status: DC

## 2014-05-01 MED ORDER — LORAZEPAM 2 MG/ML IJ SOLN
0.5000 mg | INTRAMUSCULAR | Status: DC | PRN
  Administered 2014-05-02: 0.5 mg via INTRAVENOUS
  Filled 2014-05-01 (×2): qty 1

## 2014-05-01 MED ORDER — DIPHENHYDRAMINE HCL 50 MG/ML IJ SOLN: 12.5000 mg | Freq: Four times a day (QID) | INTRAMUSCULAR | Status: DC

## 2014-05-01 MED ORDER — BUDESONIDE 0.25 MG/2ML IN SUSP
0.5000 mg | Freq: Two times a day (BID) | RESPIRATORY_TRACT | Status: DC
  Administered 2014-05-01 – 2014-05-02 (×2): 0.5 mg via RESPIRATORY_TRACT
  Filled 2014-05-01 (×4): qty 4

## 2014-05-01 MED ORDER — RACEPINEPHRINE HCL 2.25 % IN NEBU
INHALATION_SOLUTION | RESPIRATORY_TRACT | Status: AC
  Administered 2014-05-01: 0.5 mL
  Filled 2014-05-01: qty 0.5

## 2014-05-01 MED ORDER — IPRATROPIUM-ALBUTEROL 0.5-2.5 (3) MG/3ML IN SOLN
3.0000 mL | Freq: Once | RESPIRATORY_TRACT | Status: AC
  Administered 2014-05-01: 3 mL via RESPIRATORY_TRACT
  Filled 2014-05-01: qty 3

## 2014-05-01 MED ORDER — IPRATROPIUM BROMIDE 0.02 % IN SOLN
0.5000 mg | Freq: Once | RESPIRATORY_TRACT | Status: AC
  Administered 2014-05-01: 0.5 mg via RESPIRATORY_TRACT
  Filled 2014-05-01: qty 2.5

## 2014-05-01 MED ORDER — METHYLPREDNISOLONE SODIUM SUCC 125 MG IJ SOLR
80.0000 mg | Freq: Two times a day (BID) | INTRAMUSCULAR | Status: DC
  Administered 2014-05-01: 80 mg via INTRAVENOUS
  Filled 2014-05-01: qty 1.28
  Filled 2014-05-01: qty 2

## 2014-05-01 MED ORDER — POTASSIUM CHLORIDE CRYS ER 20 MEQ PO TBCR
40.0000 meq | EXTENDED_RELEASE_TABLET | ORAL | Status: DC
  Filled 2014-05-01: qty 2

## 2014-05-01 MED ORDER — LORAZEPAM 2 MG/ML IJ SOLN
INTRAMUSCULAR | Status: AC
  Filled 2014-05-01: qty 1

## 2014-05-01 MED ORDER — IPRATROPIUM-ALBUTEROL 0.5-2.5 (3) MG/3ML IN SOLN
3.0000 mL | Freq: Four times a day (QID) | RESPIRATORY_TRACT | Status: DC
  Administered 2014-05-01: 3 mL via RESPIRATORY_TRACT
  Filled 2014-05-01: qty 3

## 2014-05-01 MED ORDER — FAMOTIDINE IN NACL 20-0.9 MG/50ML-% IV SOLN
20.0000 mg | Freq: Two times a day (BID) | INTRAVENOUS | Status: DC
  Filled 2014-05-01: qty 50

## 2014-05-01 MED ORDER — SODIUM CHLORIDE 0.45 % IV SOLN
INTRAVENOUS | Status: DC
  Administered 2014-05-01: 17:00:00 via INTRAVENOUS

## 2014-05-01 MED ORDER — DIPHENHYDRAMINE HCL 50 MG/ML IJ SOLN: 25.0000 mg | Freq: Once | INTRAMUSCULAR | Status: DC

## 2014-05-01 MED ORDER — FAMOTIDINE IN NACL 20-0.9 MG/50ML-% IV SOLN
20.0000 mg | Freq: Two times a day (BID) | INTRAVENOUS | Status: DC
  Administered 2014-05-01 – 2014-05-02 (×2): 20 mg via INTRAVENOUS
  Filled 2014-05-01 (×3): qty 50

## 2014-05-01 MED ORDER — RACEPINEPHRINE HCL 2.25 % IN NEBU
0.5000 mL | INHALATION_SOLUTION | Freq: Once | RESPIRATORY_TRACT | Status: AC
  Administered 2014-05-01: 0.5 mL via RESPIRATORY_TRACT

## 2014-05-01 MED ORDER — METHYLPREDNISOLONE SODIUM SUCC 125 MG IJ SOLR
80.0000 mg | Freq: Four times a day (QID) | INTRAMUSCULAR | Status: DC
  Administered 2014-05-01 – 2014-05-02 (×4): 80 mg via INTRAVENOUS
  Filled 2014-05-01 (×7): qty 1.28

## 2014-05-01 MED ORDER — LORAZEPAM 2 MG/ML IJ SOLN
1.0000 mg | Freq: Once | INTRAMUSCULAR | Status: AC
  Administered 2014-05-01: 1 mg via INTRAVENOUS

## 2014-05-01 MED ORDER — EPINEPHRINE 0.3 MG/0.3ML IJ SOAJ
0.3000 mg | Freq: Once | INTRAMUSCULAR | Status: AC
  Administered 2014-05-01: 0.3 mg via INTRAMUSCULAR

## 2014-05-01 MED ORDER — POTASSIUM CHLORIDE 20 MEQ/15ML (10%) PO LIQD
40.0000 meq | Freq: Two times a day (BID) | ORAL | Status: DC
  Filled 2014-05-01 (×2): qty 30

## 2014-05-01 MED ORDER — ENOXAPARIN SODIUM 40 MG/0.4ML ~~LOC~~ SOLN
40.0000 mg | SUBCUTANEOUS | Status: DC
  Administered 2014-05-01: 40 mg via SUBCUTANEOUS
  Filled 2014-05-01 (×2): qty 0.4

## 2014-05-01 MED ORDER — ACETAMINOPHEN 325 MG PO TABS
650.0000 mg | ORAL_TABLET | Freq: Four times a day (QID) | ORAL | Status: DC | PRN
  Administered 2014-05-01 – 2014-05-02 (×2): 650 mg via ORAL
  Filled 2014-05-01 (×2): qty 2

## 2014-05-01 MED ORDER — SODIUM CHLORIDE 0.9 % IV SOLN: 250.0000 mL | INTRAVENOUS | Status: DC | PRN

## 2014-05-01 MED ORDER — SODIUM CHLORIDE 0.9 % IV BOLUS (SEPSIS)
1000.0000 mL | Freq: Once | INTRAVENOUS | Status: AC
  Administered 2014-05-01: 1000 mL via INTRAVENOUS

## 2014-05-01 MED ORDER — ALBUTEROL (5 MG/ML) CONTINUOUS INHALATION SOLN
10.0000 mg/h | INHALATION_SOLUTION | RESPIRATORY_TRACT | Status: DC
  Administered 2014-05-01: 10 mg/h via RESPIRATORY_TRACT
  Filled 2014-05-01 (×2): qty 20

## 2014-05-01 MED ORDER — DIPHENHYDRAMINE HCL 50 MG/ML IJ SOLN
12.5000 mg | Freq: Four times a day (QID) | INTRAMUSCULAR | Status: DC
  Administered 2014-05-01 – 2014-05-02 (×3): 12.5 mg via INTRAVENOUS
  Filled 2014-05-01: qty 1
  Filled 2014-05-01 (×2): qty 0.25
  Filled 2014-05-01: qty 1
  Filled 2014-05-01: qty 0.25
  Filled 2014-05-01: qty 1
  Filled 2014-05-01: qty 0.25

## 2014-05-01 MED ORDER — IPRATROPIUM-ALBUTEROL 0.5-2.5 (3) MG/3ML IN SOLN
3.0000 mL | RESPIRATORY_TRACT | Status: DC
  Administered 2014-05-01 – 2014-05-02 (×3): 3 mL via RESPIRATORY_TRACT
  Filled 2014-05-01 (×4): qty 3

## 2014-05-01 MED ORDER — RACEPINEPHRINE HCL 2.25 % IN NEBU: 0.5000 mL | INHALATION_SOLUTION | Freq: Once | RESPIRATORY_TRACT | Status: DC

## 2014-05-01 NOTE — ED Notes (Signed)
Pt labored breathing increased. HR 140. Stridor returned. MD, RN to bedside, with respiratory.
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
## 2014-05-01 NOTE — Progress Notes (Signed)
[facility] Progress Note Patient Name: [name] DOB: 04-24-1976 MRN: [id number]  Date of Service  05/01/2014   HPI/Events of Note   Female admitted with anaphylaxis from bee sting.  Nurses note increased work of breathing.  Racemic epi neb and albuterol given.  Work of breathing seems to be better.  Systolic bp 100.  eICU Interventions  ABG now freq of alb/atr neb changed from q6 hrs to every 4 hrs Solumedrol changed from every 12 hrs to every 6 hrs   Intervention Category Intermediate Interventions: Bronchospasm - evaluation and treatment  Mauri Brooklyn, P 05/01/2014, 4:32 PM

## 2014-05-01 NOTE — ED Notes (Signed)
Per EMS- Pt had bee sting, allergic reaction. Bee sting to right foot. Respiratory distress noted, wheezing throughout. Status worsening upon arrival. Stridor noted at current. Given 50 zantac, 50 benadryl, 2 g mag, 10 mg albuterol, 0.5 atrovent, 4 mg zofran. 99% on nebulizer, HR 130 ST, BP 150/80.
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
## 2014-05-02 ENCOUNTER — Encounter: Payer: Self-pay | Admitting: Family Medicine

## 2014-05-02 ENCOUNTER — Encounter (HOSPITAL_COMMUNITY): Payer: Self-pay | Admitting: General Practice

## 2014-05-02 DIAGNOSIS — T6591XS Toxic effect of unspecified substance, accidental (unintentional), sequela: Secondary | ICD-10-CM

## 2014-05-02 LAB — BASIC METABOLIC PANEL
Anion gap: 14 (ref 5–15)
BUN: 11 mg/dL (ref 6–23)
CO2: 19 mEq/L (ref 19–32)
CREATININE: 0.5 mg/dL (ref 0.50–1.10)
Calcium: 8.9 mg/dL (ref 8.4–10.5)
Chloride: 105 mEq/L (ref 96–112)
GFR calc non Af Amer: >90 mL/min (ref >90)
Glucose, Bld: 152 mg/dL — ABNORMAL HIGH (ref 70–99)
POTASSIUM: 4.4 meq/L (ref 3.7–5.3)
Sodium: 138 mEq/L (ref 137–147)

## 2014-05-02 LAB — GLUCOSE, CAPILLARY
GLUCOSE-CAPILLARY: 152 mg/dL — AB (ref 70–99)
GLUCOSE-CAPILLARY: 177 mg/dL — AB (ref 70–99)
GLUCOSE-CAPILLARY: 204 mg/dL — AB (ref 70–99)

## 2014-05-02 MED ORDER — DIPHENHYDRAMINE HCL 25 MG PO CAPS: 25.0000 mg | ORAL_CAPSULE | Freq: Four times a day (QID) | ORAL | Status: DC | PRN

## 2014-05-02 MED ORDER — IPRATROPIUM-ALBUTEROL 0.5-2.5 (3) MG/3ML IN SOLN: 3.0000 mL | RESPIRATORY_TRACT | Status: DC | PRN

## 2014-05-02 MED ORDER — EPINEPHRINE 0.3 MG/0.3ML IJ DEVI: 0.3000 mg | Freq: Once | INTRAMUSCULAR | Status: DC

## 2014-05-02 NOTE — Discharge Summary (Signed)
Name: [name] MRN: [id number] DOB: March 10, 1976    ADMISSION DATE:  05/01/2014   REFERRING MD :  EDP PRIMARY SERVICE:  PCCM Primary Care Provider: Annabell Sabal, MD  CHIEF COMPLAINT:  Anaphylaxis   BRIEF PATIENT DESCRIPTION: 38 yo female with hx asthma and known bee venom allergy admitted 7/26 with anaphylaxis from yellow jacket sting.  Stridorous in ER and PCCM called to admit.   Admission Date - 05/01/14 Date of Discharge - 05/02/14  Chief Complaint: Respiratory distress, wheezing, stridor  Admission Diagnosis: Anaphylaxis, secondary to yellow jacket  Discharge Problem List: Patient Active Problem List   Diagnosis Date Noted  . Anaphylactic reaction 05/01/2014  . Stridor 05/01/2014  . Biliary colic 62/22/9798  . RUQ pain 05/28/2013  . Preventative health care 04/29/2012  . Viral exanthem 01/01/2012  . Bee sting-induced anaphylaxis 06/18/2011  . Stabbing headache 05/14/2011  . Trigger thumb of right hand 03/08/2011  . Thumb pain 03/05/2011  . Allergic rhinitis 01/22/2011  . Carpal tunnel syndrome 01/22/2011  . HAND PAIN, LEFT 02/01/2010  . ANXIETY 12/04/2006  . Unspecified asthma 12/04/2006  . MIGRAINES, HX OF 12/04/2006   Disposition: Home  Discharge Condition: Stable  SIGNIFICANT EVENTS / STUDIES:  7/26 - Admit, in ED with diffuse wheezing / stridor, in resp distress, s/p Epi IM and racemic epi. Received Zantac, benadryl, Mag, Albuterol, atrovent, Zofran 7/26 - required BiPAP, off o/n 7/27 - Remained in ICU o/n for monitoring, overall improved, cont Duonebs / Solumedrol 7/27 - Discharge to home today  ANTIBIOTICS / CULTURES: n/a  SUBJECTIVE:  Reports feeling significantly improved today, required BiPAP some yesterday but not overnight. Complains of some persistent "sore throat" but denies any airway edema, numbness. Admits to some chest tightness, but no dyspnea, chest pain.  VITAL SIGNS: Temp:  [98.1 F (36.7 C)-99.1 F (37.3 C)] 98.3 F (36.8 C)  (07/27 0754) Pulse Rate:  [84-139] 113 (07/27 0900) Resp:  [14-49] 19 (07/27 0900) BP: (93-139)/(39-100) 129/100 mmHg (07/27 0900) SpO2:  [94 %-100 %] 96 % (07/27 0900) FiO2 (%):  [28 %] 28 % (07/26 1239)  PHYSICAL EXAMINATION:  General:  Well-appearing, conversational, comfortable, NAD Neuro:  Awake, alert, oriented, non-focal Cardiovascular:  Tachycardia, regular rhythm, no murmurs Lungs:  Mostly CTAB, mild +insp scattered high pitched wheezes, otherwise no coarse breath sounds or focal findings. Normal work of breathing, speaks full sentences  Abdomen:  Soft, non-tender, +active BS Skin:  Warm and dry Ext: significant improved RLE (mostly resolved edema and erythema), intact peripheral pulses   Recent Labs Lab 05/01/14 1149 05/02/14 0223  NA 140 138  K 2.9* 4.4  CL 102 105  CO2 19 19  BUN 16 11  CREATININE 0.64 0.50  GLUCOSE 181* 152*    Recent Labs Lab 05/01/14 1149  HGB 13.7  HCT 40.4  WBC 15.6*  PLT 308   Dg Neck Soft Tissue  05/01/2014   CLINICAL DATA:  Allergic reaction  EXAM: NECK SOFT TISSUES - 1+ VIEW  COMPARISON:  None.  FINDINGS: Frontal and lateral views were obtained. There is no evidence of retropharyngeal soft tissue swelling, epiglottic enlargement, or aryepiglottic enlargement. The cervical airway is unremarkable and no radio-opaque foreign body identified. There is no air-fluid level to suggest abscess. Tonsils and adenoids appear normal. Tongue base region appears normal.  IMPRESSION: No abnormality noted.   Electronically Signed   By: Lowella Grip M.D.   On: 05/01/2014 12:56   Dg Chest Portable 1 View  05/01/2014   CLINICAL DATA:  Allergic reaction with difficulty breathing  EXAM: PORTABLE CHEST - 1 VIEW  COMPARISON:  Chest radiograph and chest CT July 20, 2012  FINDINGS: Lungs are clear. The heart size and pulmonary vascularity are normal. No adenopathy. No bone lesions.  IMPRESSION: No abnormality noted.   Electronically Signed   By: Lowella Grip M.D.   On: 05/01/2014 12:19   ASSESSMENT / PLAN:  PULMONARY / IMMUNOLOGY: A: Anaphylaxis, yellow jacket, setting of known hx anaphylaxis (7/26, s/p Epi at time of sting and in ED) - Resolved Asthma exacerbation, secondary to anaphylaxis Respiratory Failure (required BiPAP) - Resolved, off BiPAP 7/26 P: Discharge to home today, no longer require hospital monitoring Discontinue PRN BiPAP Supplemental O2 PRN Duonebs q 4 hr PRN Budesonide BID scheduled Transition to Prednisone PO x3 days, discontinue Solumedrol Continue H2 blockers Pepcid, benadryl   CARDIOVASCULAR A: Tachycardia - expected s/p Epi, Albuterol P:  Monitor BP Discontinue IVF  RENAL A: Hypokalemia - Resolved, 2.6 >> 4.4 P:   Monitor Replace electrolytes PRN  GASTROINTESTINAL A: No acute issues P:   Regular diet, tolerating PO Pepcid IV (also for H2 blocker in setting of anaphylaxis)  HEMATOLOGIC A:  No acute issues Leukocytosis, likely secondary to steroids P:  Monitor  INFECTIOUS A: No acute issues P:   Monitor  ENDOCRINE A: Steroids in setting of anaphylaxis / asthma   P:   Transition to Prednisone PO x 3 days (s/p 24 hrs Solumedrol IV)  NEUROLOGIC A: No acute issues P:   Monitor Discontinue Ativan IV No sedation needed  TODAY'S SUMMARY: Discharge to home today, given significant improvement over past 24 hours and resolution of anaphylaxis reaction. No longer requiring BiPAP, mostly resolved symptoms. Continue bronchodilators. Tolerating regular diet, transition to PO Prednisone x3 more days and d/c.  Discharge Medications:    Medication List         albuterol 108 (90 BASE) MCG/ACT inhaler  Commonly known as:  VENTOLIN HFA  Inhale 2 puffs into the lungs 4 (four) times daily as needed. For cough     cetirizine 10 MG chewable tablet  Commonly known as:  ZYRTEC  Chew 10 mg by mouth daily.     EPINEPHrine 0.3 mg/0.3 mL Devi  Commonly known as:  EPI-PEN  Inject 0.3  mLs (0.3 mg total) into the muscle once.       Discharge Instructions: Please refer to Patient Instructions section of EMR for full details.  Patient was counseled important signs and symptoms that should prompt return to medical care, changes in medications, dietary instructions, activity restrictions, and follow up appointments.   Follow-Up Appointments: Follow-up Information   Follow up with [facility], MD In 2 weeks.   Specialty:  Family Medicine   Contact information:   [place] [address] [place], DO 05/02/2014, 11:03 AM [place], PGY-2  Patient seen and examined, agree with above note.  I dictated the care and orders written for this patient under my direction.  Overall spent >31min on discharge preparations / face-to-face time.  Jennet Maduro, MD [phone]

## 2014-05-02 NOTE — Progress Notes (Signed)
Utilization Review Completed.Donne Anon T7/27/2015

## 2014-05-02 NOTE — Care Management Note (Signed)
    Page 1 of 1   05/02/2014     12:37:20 PM CARE MANAGEMENT NOTE 05/02/2014  Patient:  [name], [name]   Account Number:  [id number]  Date Initiated:  05/02/2014  Documentation initiated by:  Elissa Hefty  Subjective/Objective Assessment:   adm w anaphalactic reaction     Action/Plan:   lives w husband, pcp dr Mingo Amber   Anticipated DC Date:  05/02/2014   Anticipated DC Plan:  [place]  CM consult  [facility]      Choice offered to / List presented to:             Status of service:   Medicare Important Message given?   (If response is "NO", the following Medicare IM given date fields will be blank) Date Medicare IM given:   Medicare IM given by:   Date Additional Medicare IM given:   Additional Medicare IM given by:    Discharge Disposition:  HOME/SELF CARE  Per UR Regulation:  Reviewed for med. necessity/level of care/duration of stay  If discussed at [place] of Stay Meetings, dates discussed:    Comments:

## 2014-05-02 NOTE — Discharge Instructions (Signed)
You were hospitalized in the ICU due to Anaphylaxis reaction from yellow jacket sting. Sent refill of Epi-pen to pharmacy Please call to schedule a follow-up with Dr. Mingo Amber at [facility] within 1-2 weeks.  Anaphylactic Reaction An anaphylactic reaction is a sudden, severe allergic reaction. It affects the whole body. It can be life threatening. You may need to stay in the hospital.  [place] a medical bracelet or necklace that lists your allergy.  Carry your allergy kit or medicine shot to treat severe allergic reactions with you. These can save your life.  Do not drive until medicine from your shot has worn off, unless your doctor says it is okay.  If you have hives or a rash:  Take medicine as told by your doctor.  You may take over-the-counter antihistamine medicine.  Place cold cloths on your skin. Take baths in cool water. Avoid hot baths and hot showers. GET HELP RIGHT AWAY IF:   Your mouth is puffy (swollen), or you have trouble breathing.  You start making whistling sounds when you breathe (wheezing).  You have a tight feeling in your chest or throat.  You have a rash, hives, puffiness, or itching on your body.  You throw up (vomit) or have watery poop (diarrhea).  You feel dizzy or pass out (faint).  You think you are having an allergic reaction.  You have new symptoms. This is an emergency. Use your medicine shot or allergy kit as told. Call your local emergency services (911 in [place] Even if you feel better after the shot, you need to go to the hospital emergency department. MAKE SURE YOU:   Understand these instructions.  Will watch your condition.  Will get help right away if you are not doing well or get worse. Document Released: 03/11/2008 Document Revised: 03/24/2012 Document Reviewed: 12/25/2011 [facility] Patient Information 2015 [place], [place]. This information is not intended to replace advice given to you by your health care  provider. Make sure you discuss any questions you have with your health care provider.

## 2014-05-02 NOTE — Progress Notes (Signed)
Patient being  Discharged home with husband. Discharge paperwork gone over with pt. Verbalizes understanding. Prescription faxed by resident to patient pharmacy

## 2014-05-03 ENCOUNTER — Encounter: Payer: Self-pay | Admitting: Family Medicine

## 2014-05-03 ENCOUNTER — Ambulatory Visit (INDEPENDENT_AMBULATORY_CARE_PROVIDER_SITE_OTHER): Payer: BC Managed Care – PPO | Admitting: Family Medicine

## 2014-05-03 VITALS — BP 109/72 | HR 91 | Temp 98.2°F | Ht 63.0 in | Wt 208.0 lb

## 2014-05-03 DIAGNOSIS — S4992XA Unspecified injury of left shoulder and upper arm, initial encounter: Secondary | ICD-10-CM

## 2014-05-03 DIAGNOSIS — S41109A Unspecified open wound of unspecified upper arm, initial encounter: Secondary | ICD-10-CM

## 2014-05-03 NOTE — Progress Notes (Signed)
   Subjective:    Patient ID: [name], female    DOB: 12/20/1975, 38 y.o.   MRN: [id number]  HPI  [name] is here for swelling in right hand.  Patient discharge yesterday from ICU for anaphylactic shock. She had a hacking cough last night and a hard time sleeping. She denies fevers or chills. She did have shortness of breath but was like that at the hospital.  No history of clots.     Current Outpatient Prescriptions on File Prior to Visit  Medication Sig Dispense Refill  . albuterol (VENTOLIN HFA) 108 (90 BASE) MCG/ACT inhaler Inhale 2 puffs into the lungs 4 (four) times daily as needed. For cough  1 Inhaler  1  . cetirizine (ZYRTEC) 10 MG chewable tablet Chew 10 mg by mouth daily.      Marland Kitchen EPINEPHrine (EPI-PEN) 0.3 mg/0.3 mL DEVI Inject 0.3 mLs (0.3 mg total) into the muscle once.  1 Device  0   No current facility-administered medications on file prior to visit.   Review of Systems See HPI     Objective:   Physical Exam BP 109/72  Pulse 91  Temp(Src) 98.2 F (36.8 C) (Oral)  Ht 5\' 3"  (1.6 m)  Wt 208 lb (94.348 kg)  BMI 36.85 kg/m2 Gen: NAD, alert, cooperative with exam, well-appearing Skin: erythematous streak extending from left antecubital fossa distally to lateral wrist, mildly tender to palpation, no swelling, FROM of left wrist, normal grip strength, +2 pulses in left UE        Assessment & Plan:

## 2014-05-03 NOTE — Patient Instructions (Signed)
Thank you for coming in,   I think the best thing to do right now is watch your arm.  Any increased redness with fevers, chills or night sweats, then you need to return.   If the swelling gets worse and you become short of breath with a new chest pain then please return.    Please feel free to call with any questions or concerns at any time, at [phone]. --Dr. Raeford Razor

## 2014-05-04 DIAGNOSIS — S4992XA Unspecified injury of left shoulder and upper arm, initial encounter: Secondary | ICD-10-CM | POA: Insufficient documentation

## 2014-05-04 NOTE — Assessment & Plan Note (Signed)
Most likely related to the IV that was placed in her arm while she was in the ICU. Afebrile, pulses intact distally and FROM of wrist and normal grip strength.  - given warning signs of clot or infection  - if any of these present themselves then call clinic or go straight to ED - Discussed with Dr. Ree Kida.

## 2014-05-13 NOTE — Discharge Summary (Signed)
Patient seen and examined, agree with above note.  I dictated the care and orders written for this patient under my direction.  Wesam G Yacoub, MD [phone] 

## 2014-07-22 ENCOUNTER — Other Ambulatory Visit: Payer: Self-pay

## 2015-03-31 ENCOUNTER — Other Ambulatory Visit: Payer: Self-pay | Admitting: Family Medicine

## 2015-08-23 ENCOUNTER — Telehealth: Payer: Self-pay | Admitting: Family Medicine

## 2015-08-23 NOTE — Telephone Encounter (Signed)
Would like to talk to nurse. Tested positive for influenza B. Wants dr to write a prescription for tamiflu for the husband and 2 children

## 2015-08-23 NOTE — Telephone Encounter (Signed)
Unfortunately we don't have any record where she's been tested or any of her symptoms.  If she can get [place] the records, then we could talk more about treatment.  Also, Tamiflu is not necessarily recommended for close contacts unless they meet certain criteria.

## 2015-08-25 NOTE — Telephone Encounter (Signed)
Contacted mom and she was concerned that her daughter has the flu wanted to know if there was anything else that could be given to her child for this.  There is documentation in childs chart in reference to this.[name], [name] D, [place]

## 2015-10-08 HISTORY — PX: JOINT REPLACEMENT: SHX530

## 2015-10-20 ENCOUNTER — Encounter: Payer: Self-pay | Admitting: Family Medicine

## 2015-10-20 ENCOUNTER — Ambulatory Visit (INDEPENDENT_AMBULATORY_CARE_PROVIDER_SITE_OTHER): Payer: BLUE CROSS/BLUE SHIELD | Admitting: Family Medicine

## 2015-10-20 VITALS — BP 133/84 | HR 94 | Temp 98.4°F | Ht 63.0 in | Wt 225.3 lb

## 2015-10-20 DIAGNOSIS — J45909 Unspecified asthma, uncomplicated: Secondary | ICD-10-CM | POA: Diagnosis not present

## 2015-10-20 DIAGNOSIS — Z23 Encounter for immunization: Secondary | ICD-10-CM | POA: Diagnosis not present

## 2015-10-20 DIAGNOSIS — M25562 Pain in left knee: Secondary | ICD-10-CM | POA: Diagnosis not present

## 2015-10-20 DIAGNOSIS — Z96652 Presence of left artificial knee joint: Secondary | ICD-10-CM | POA: Insufficient documentation

## 2015-10-20 DIAGNOSIS — M722 Plantar fascial fibromatosis: Secondary | ICD-10-CM | POA: Insufficient documentation

## 2015-10-20 DIAGNOSIS — Z0000 Encounter for general adult medical examination without abnormal findings: Secondary | ICD-10-CM

## 2015-10-20 DIAGNOSIS — J320 Chronic maxillary sinusitis: Secondary | ICD-10-CM

## 2015-10-20 DIAGNOSIS — J329 Chronic sinusitis, unspecified: Secondary | ICD-10-CM | POA: Insufficient documentation

## 2015-10-20 HISTORY — DX: Presence of left artificial knee joint: Z96.652

## 2015-10-20 MED ORDER — AMOXICILLIN 875 MG PO TABS: 875.0000 mg | ORAL_TABLET | Freq: Two times a day (BID) | ORAL | Status: DC

## 2015-10-20 MED ORDER — ALBUTEROL SULFATE (2.5 MG/3ML) 0.083% IN NEBU
2.5000 mg | INHALATION_SOLUTION | Freq: Once | RESPIRATORY_TRACT | Status: AC
  Administered 2015-10-20: 2.5 mg via RESPIRATORY_TRACT

## 2015-10-20 MED ORDER — NAPROXEN 500 MG PO TABS: 500.0000 mg | ORAL_TABLET | Freq: Two times a day (BID) | ORAL | Status: DC

## 2015-10-20 MED ORDER — ALBUTEROL SULFATE HFA 108 (90 BASE) MCG/ACT IN AERS: 2.0000 | INHALATION_SPRAY | Freq: Four times a day (QID) | RESPIRATORY_TRACT | Status: DC | PRN

## 2015-10-20 MED ORDER — FLUTICASONE PROPIONATE 50 MCG/ACT NA SUSP: 2.0000 | Freq: Every day | NASAL | Status: DC

## 2015-10-20 NOTE — Assessment & Plan Note (Signed)
Superimposed upon recent influenza and asthma exacerbation.  Treat with amoxicillin.
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
## 2015-10-20 NOTE — Addendum Note (Signed)
Addended by: Katharina Caper, Evie Crumpler D on: 10/20/2015 03:52 PM   Modules accepted: Orders

## 2015-10-20 NOTE — Assessment & Plan Note (Signed)
Needs pap.  Defer for next visit due to patient request.

## 2015-10-20 NOTE — Patient Instructions (Addendum)
Work on doing the leg raises as we discussed.   Try inserts for both of your feet.    Take the Naproxen for pain relief when you need it.   Use Flonase after your sinus infection improves.    You will receive a call from the Sports Medicine center in the next several days.    For the next several days, schedule your albuterol every 6 hours or so and before bed.  Come back to see me in 4 - 6 weeks to check your asthma and see how your pain is improving.    If you have any shortness of breath, fevers, chest pain, or worsening symptoms, please come back immediately or go to the ER after hours.  It was good to see you today!    Plantar Fasciitis Plantar fasciitis is a painful foot condition that affects the heel. It occurs when the band of tissue that connects the toes to the heel bone (plantar fascia) becomes irritated. This can happen after exercising too much or doing other repetitive activities (overuse injury). The pain from plantar fasciitis can range from mild irritation to severe pain that makes it difficult for you to walk or move. The pain is usually worse in the morning or after you have been sitting or lying down for a while. CAUSES This condition may be caused by:  Standing for long periods of time.  Wearing shoes that do not fit.  Doing high-impact activities, including running, aerobics, and ballet.  Being overweight.  Having an abnormal way of walking (gait).  Having tight calf muscles.  Having high arches in your feet.  Starting a new athletic activity. SYMPTOMS The main symptom of this condition is heel pain. Other symptoms include:  Pain that gets worse after activity or exercise.  Pain that is worse in the morning or after resting.  Pain that goes away after you walk for a few minutes. DIAGNOSIS This condition may be diagnosed based on your signs and symptoms. Your health care provider will also do a physical exam to check for:  A tender area on the  bottom of your foot.  A high arch in your foot.  Pain when you move your foot.  Difficulty moving your foot. You may also need to have imaging studies to confirm the diagnosis. These can include:  X-rays.  Ultrasound.  MRI. TREATMENT  Treatment for plantar fasciitis depends on the severity of the condition. Your treatment may include:  Rest, ice, and over-the-counter pain medicines to manage your pain.  Exercises to stretch your calves and your plantar fascia.  A splint that holds your foot in a stretched, upward position while you sleep (night splint).  Physical therapy to relieve symptoms and prevent problems in the future.  Cortisone injections to relieve severe pain.  Extracorporeal shock wave therapy (ESWT) to stimulate damaged plantar fascia with electrical impulses. It is often used as a last resort before surgery.  Surgery, if other treatments have not worked after 12 months. HOME CARE INSTRUCTIONS  Take medicines only as directed by your health care provider.  Avoid activities that cause pain.  Roll the bottom of your foot over a bag of ice or a bottle of cold water. Do this for 20 minutes, 3-4 times a day.  Perform simple stretches as directed by your health care provider.  Try wearing athletic shoes with air-sole or gel-sole cushions or soft shoe inserts.  Wear a night splint while sleeping, if directed by your health care  provider.  Keep all follow-up appointments with your health care provider. PREVENTION   Do not perform exercises or activities that cause heel pain.  Consider finding low-impact activities if you continue to have problems.  Lose weight if you need to. The best way to prevent plantar fasciitis is to avoid the activities that aggravate your plantar fascia. SEEK MEDICAL CARE IF:  Your symptoms do not go away after treatment with home care measures.  Your pain gets worse.  Your pain affects your ability to move or do your daily  activities.   This information is not intended to replace advice given to you by your health care provider. Make sure you discuss any questions you have with your health care provider.   Document Released: 06/18/2001 Document Revised: 06/14/2015 Document Reviewed: 08/03/2014 Elsevier Interactive Patient Education [employer].

## 2015-10-20 NOTE — Assessment & Plan Note (Signed)
Worse in RIght foot, but present bilaterally.   Classic symptoms and exam.  Treat with naprosyn, foot stretching, and OTC inserts. Patient desires referral to sports medicine.  Will place today.

## 2015-10-20 NOTE — Assessment & Plan Note (Signed)
With acute exacerbation.   Given nebulized albuterol treatment here.   Ok to give flu shot.

## 2015-10-20 NOTE — Assessment & Plan Note (Addendum)
Likely exacerbated by obesity.  Question possible cartilage tear plus or minus ligament tear?  Apprehension during testing, but only real pain with valgus testing and ant drawer test.  Without joint laxity. She is being referred to sports medicine at her request.   Discussed weight loss.  Naproxen will help with pain as well.   DOesn't enjoy needles -- hold off on corticosteroid injection for now.   Quad strengthening exercises provided today.

## 2015-10-24 ENCOUNTER — Telehealth: Payer: Self-pay | Admitting: Family Medicine

## 2015-10-24 NOTE — Telephone Encounter (Signed)
Patient was seen by Dr. Mingo Amber last week and given an antibiotic. Patient states she is still not feeling any better and would like advice to what she could do now? Please advise.

## 2015-10-25 MED ORDER — PREDNISONE 50 MG PO TABS: ORAL_TABLET | ORAL | Status: DC

## 2015-10-25 NOTE — Telephone Encounter (Signed)
Called and spoke with [name].  She is still not feeling well.  Coughing most of the night, inability to sleep.  Still wheezing.  I could hear her wheezing and coughing on the phone.    I will treat with prednisone and repeat Lortab elixir to treat.  Told her that if she is still having difficulty by tomorrow afternoon, she should be seen again.

## 2015-10-25 NOTE — Telephone Encounter (Signed)
Pt is checking the status of her request and or advice on how she is feeling. jw

## 2015-10-27 ENCOUNTER — Telehealth: Payer: Self-pay | Admitting: Family Medicine

## 2015-10-27 NOTE — Telephone Encounter (Signed)
Pt was called and informed to come and pick up her prescription. She will come and pick up. jw

## 2015-10-27 NOTE — Telephone Encounter (Signed)
Paged by Latina Craver after she had received phone call by pharmacy stating that Lortab elixir could not be faxed but could be sent electronically.  However, when I attempted to do  this, it states this can't be printed because it's a controlled substance.  Would recommend having the patient come to clinic to pick up the prescription.

## 2015-11-01 ENCOUNTER — Ambulatory Visit (INDEPENDENT_AMBULATORY_CARE_PROVIDER_SITE_OTHER): Payer: BLUE CROSS/BLUE SHIELD | Admitting: Family Medicine

## 2015-11-01 ENCOUNTER — Ambulatory Visit
Admission: RE | Admit: 2015-11-01 | Discharge: 2015-11-01 | Disposition: A | Discharge status: Home / Self Care | Payer: BLUE CROSS/BLUE SHIELD | Source: Ambulatory Visit | Attending: Family Medicine | Admitting: Family Medicine

## 2015-11-01 ENCOUNTER — Encounter: Payer: Self-pay | Admitting: Family Medicine

## 2015-11-01 VITALS — BP 134/76 | Ht 63.0 in | Wt 220.0 lb

## 2015-11-01 DIAGNOSIS — M722 Plantar fascial fibromatosis: Secondary | ICD-10-CM

## 2015-11-01 DIAGNOSIS — M25562 Pain in left knee: Secondary | ICD-10-CM | POA: Diagnosis not present

## 2015-11-01 MED ORDER — MELOXICAM 15 MG PO TABS: 15.0000 mg | ORAL_TABLET | Freq: Every day | ORAL | Status: DC

## 2015-11-01 NOTE — Assessment & Plan Note (Signed)
Continue conservative measures - Addition of green inserts and scaphoid pads for support - Ice water subermission QHS - Consider Nitro vs injection if no improvement in 4-6 weeks - Can consider PRP vs tibial nerve block with percutaneous fasciotomy as well

## 2015-11-01 NOTE — Progress Notes (Signed)
  Esperansa AZELL GASCHLER - 40 y.o. female MRN [id number]  Date of birth: Dec 09, 1975 Avacyn KISHAWNA RAPA is a 40 y.o. female who presents today for B/L foot pain and L knee pain.  B/L Foot pain initial visit (11/01/15) - patient presents with medial heel pain right greater than left for the past 9-12 months now. Pain is worse with first steps or prolonged sitting. She has tried ice wraps as well as stretches with minimal relief. Denies any paresthesias going into her digits at this time. Has tried switching shoes but does wear flats to work. Has not had injection or inserts to date.  L Knee pain, intial visit (11/01/15) - patient presents with ongoing left knee pain for several months duration. Medial aspect and does not recite a specific injury. Worse with ascending descending stairs or prolonged sitting. Has not had injection but has tried Naprosyn and ice with minimal relief. Pain is worse with any type of twisting and does state that she occasionally gets an effusion.  PMHx - Updated and reviewed.  Contributory factors include: Obesity  PSHx - Updated and reviewed.  Contributory factors include: Dequervains release R, Trigger Finger R, CTS release, RTC repair, Ulnar nerve repair.  FHx - Updated and reviewed.  Contributory factors include:  Noncontributory  Medications - updated and reviewed    ROS Per HPI.  12 point negative other than per HPI.   Exam:  Filed Vitals:   11/01/15 1515  BP: 134/76   Gen: NAD, AAO 3 Cardiorespiratory - Normal respiratory effort/rate.  RRR Skin: No rashes or erythema Extremities: No edema, pulses +2 bilateral upper and lower extremity  Feet: B/L Pes cavus.  TTP medial process calcaneus B/L R>L.  No erythema or swelling.  Collapse of transverse arch R>L.  Able to ambulate 4 steps without limp  L Knee: Normal to inspection with no erythema or effusion or obvious bony abnormalities. TTP medial joint line. Palpable plica/fibrotic tissue around the medial femoral  condyle. ROM normal in flexion and extension and lower leg rotation. Ligaments with solid consistent endpoints including ACL, PCL, LCL, MCL. + Mcmurray's and provocative meniscal tests including Thessaly.  Non painful patellar compression. Patellar and quadriceps tendons unremarkable. Hamstring and quadriceps strength is normal.

## 2015-11-01 NOTE — Assessment & Plan Note (Signed)
Agree that this is most likely fibrocartilage medial meniscus injury vs possible medial plica irritation/symptomatic plica.   - Start with 3 view L knee including standing A/P, lateral, sunrise - Start Mobic 15 mg qd - Ice to area and quad strengthening - If no improvement in 4-6 weeks, consider [place] to evaluate meniscus/plica

## 2015-11-04 ENCOUNTER — Telehealth: Payer: Self-pay | Admitting: Family Medicine

## 2015-11-04 NOTE — Telephone Encounter (Signed)
Christy from [facility] called regarding patient's Lortab prescription. Inquired about the quantity to be dispensed. Reports that the prescription was written lfor qhs for 10 days. Gave verbal order for 157mL to be filled and that the patient should follow up with her PCP should she need more.  Algis Greenhouse. Jerline Pain, [place] Resident PGY-2 11/04/2015 2:19 PM

## 2015-11-17 ENCOUNTER — Encounter: Payer: Self-pay | Admitting: Family Medicine

## 2015-11-29 ENCOUNTER — Other Ambulatory Visit: Payer: Self-pay | Admitting: *Deleted

## 2015-11-29 ENCOUNTER — Ambulatory Visit (INDEPENDENT_AMBULATORY_CARE_PROVIDER_SITE_OTHER): Payer: BLUE CROSS/BLUE SHIELD | Admitting: Family Medicine

## 2015-11-29 ENCOUNTER — Encounter: Payer: Self-pay | Admitting: Family Medicine

## 2015-11-29 VITALS — BP 146/86

## 2015-11-29 DIAGNOSIS — M722 Plantar fascial fibromatosis: Secondary | ICD-10-CM

## 2015-11-29 DIAGNOSIS — M25562 Pain in left knee: Secondary | ICD-10-CM

## 2015-11-29 MED ORDER — METHYLPREDNISOLONE ACETATE 40 MG/ML IJ SUSP
40.0000 mg | Freq: Once | INTRAMUSCULAR | Status: AC
  Administered 2015-11-29: 40 mg via INTRA_ARTICULAR

## 2015-11-29 MED ORDER — METHYLPREDNISOLONE ACETATE 40 MG/ML IJ SUSP
40.0000 mg | Freq: Once | INTRAMUSCULAR | Status: AC
Start: 2015-11-29 — End: 2015-11-29
  Administered 2015-11-29: 40 mg via INTRA_ARTICULAR

## 2015-11-29 NOTE — Progress Notes (Signed)
  [name] - 40 y.o. female MRN [id number]  Date of birth: [birth year] [name] is a 40 y.o. female who presents today for B/L foot pain and L knee pain.  B/L Foot pain initial visit (11/01/15) - patient presents with medial heel pain right greater than left for the past 9-12 months now. Pain is worse with first steps or prolonged sitting. She has tried ice wraps as well as stretches with minimal relief. Denies any paresthesias going into her digits at this time. Has tried switching shoes but does wear flats to work. Has not had injection or inserts to date.  11/29/15 - Continues to be quite aggravating, especially with first steps.  Is compliant with ice/stretches daily.   L Knee pain, intial visit (11/01/15) - patient presents with ongoing left knee pain for several months duration. Medial aspect and does not recite a specific injury. Worse with ascending descending stairs or prolonged sitting. Has not had injection but has tried Naprosyn and ice with minimal relief. Pain is worse with any type of twisting and does state that she occasionally gets an effusion.  F/U 11/29/15 - Pt continues to have pain despite icing, quad strengthening, mobic.  Does continue to state worse with ascending/descending stairs.    PMHx - Updated and reviewed.  Contributory factors include: Obesity  PSHx - Updated and reviewed.  Contributory factors include: Dequervains release R, Trigger Finger R, CTS release, RTC repair, Ulnar nerve repair.  FHx - Updated and reviewed.  Contributory factors include:  Noncontributory  Medications - updated and reviewed    ROS Per HPI.  12 point negative other than per HPI.   Exam:  Filed Vitals:   11/29/15 1603  BP: 146/86   Gen: NAD, AAO 3 Cardiorespiratory - Normal respiratory effort/rate.  RRR Skin: No rashes or erythema Extremities: No edema, pulses +2 bilateral upper and lower extremity  Feet: B/L Pes cavus.  TTP medial process calcaneus B/L R>L.  No erythema  or swelling.  Collapse of transverse arch R>L.  Able to ambulate 4 steps without limp  L Knee: Normal to inspection with no erythema or effusion or obvious bony abnormalities. TTP medial joint line. Palpable plica/fibrotic tissue around the medial femoral condyle. ROM normal in flexion and extension and lower leg rotation. Ligaments with solid consistent endpoints including ACL, PCL, LCL, MCL. + Mcmurray's and provocative meniscal tests including Thessaly.  Non painful patellar compression. Patellar and quadriceps tendons unremarkable. Hamstring and quadriceps strength is normal.

## 2015-11-29 NOTE — Assessment & Plan Note (Signed)
No improvement since last visit - Continue mobic, ice, quad strengthening - Injection today - MRI to evaluate for medial meniscus tear as this is quite debilitating to her normal activities of daily living    Aspiration/Injection Procedure Note [name] [birth year]  Procedure: Injection Indications: L knee  Procedure Details Consent: Risks of procedure as well as the alternatives and risks of each were explained to the (patient/caregiver).  Consent for procedure obtained. Time Out: Verified patient identification, verified procedure, site/side was marked, verified correct patient position, special equipment/implants available, medications/allergies/relevent history reviewed, required imaging and test results available.  Performed.  The area was cleaned with iodine and alcohol swabs.    The L medial knee was injected using 2 cc's of 40 Depomedrol and 4 cc's of 1% lidocaine with a 21 1 1/2" needle.   A sterile dressing was applied.  Patient did tolerate procedure well. Estimated blood loss: none

## 2015-11-29 NOTE — Assessment & Plan Note (Signed)
Continue conservative measures.  Avoid nitro 2/2 hx of migraines.  - Continue green inserts and scaphoid pads for support.  Consider custom made orthotics - Ice water subermission QHS - Consider CAM for short period vs corticosteroid injection  - Can consider PRP vs tibial nerve block with percutaneous fasciotomy as well

## 2015-11-30 ENCOUNTER — Encounter: Payer: Self-pay | Admitting: *Deleted

## 2015-12-11 ENCOUNTER — Telehealth: Payer: Self-pay | Admitting: Family Medicine

## 2015-12-11 ENCOUNTER — Ambulatory Visit
Admission: RE | Admit: 2015-12-11 | Discharge: 2015-12-11 | Disposition: A | Discharge status: Home / Self Care | Payer: BLUE CROSS/BLUE SHIELD | Source: Ambulatory Visit | Attending: Family Medicine | Admitting: Family Medicine

## 2015-12-11 DIAGNOSIS — M25562 Pain in left knee: Secondary | ICD-10-CM

## 2015-12-11 NOTE — Telephone Encounter (Signed)
Discussed results with pt in regards to her MRI of the L knee.  There appears to be severe cartilage loss of the patellafemoral joint with cystic changes.  Most likely the cause of her ongoing Sx.  She did not respond to injection at last visit as well.  I think meeting with a surgeon is very prudent at this point to discuss possible patellafemoral replacement as a lateral release or Fulkerson would be unhelpful at the stage of her cartilage loss.  Pt amenable to this so will refer to Dr. Percell Miller or Dr. Mardelle Matte for partial knee replacement of the PF joint.   Thanks, Tamela Oddi. Hong Timm, Sports medicine fellow

## 2015-12-14 ENCOUNTER — Other Ambulatory Visit: Payer: Self-pay | Admitting: *Deleted

## 2015-12-14 DIAGNOSIS — M25562 Pain in left knee: Secondary | ICD-10-CM

## 2016-01-09 DIAGNOSIS — Z96652 Presence of left artificial knee joint: Secondary | ICD-10-CM | POA: Diagnosis not present

## 2016-01-16 DIAGNOSIS — Z96652 Presence of left artificial knee joint: Secondary | ICD-10-CM | POA: Diagnosis not present

## 2016-01-16 DIAGNOSIS — M25662 Stiffness of left knee, not elsewhere classified: Secondary | ICD-10-CM | POA: Diagnosis not present

## 2016-01-16 DIAGNOSIS — M1712 Unilateral primary osteoarthritis, left knee: Secondary | ICD-10-CM | POA: Diagnosis not present

## 2016-01-16 DIAGNOSIS — M25562 Pain in left knee: Secondary | ICD-10-CM | POA: Diagnosis not present

## 2016-01-18 DIAGNOSIS — M1712 Unilateral primary osteoarthritis, left knee: Secondary | ICD-10-CM | POA: Diagnosis not present

## 2016-01-18 DIAGNOSIS — Z96652 Presence of left artificial knee joint: Secondary | ICD-10-CM | POA: Diagnosis not present

## 2016-01-18 DIAGNOSIS — M25562 Pain in left knee: Secondary | ICD-10-CM | POA: Diagnosis not present

## 2016-01-18 DIAGNOSIS — M25662 Stiffness of left knee, not elsewhere classified: Secondary | ICD-10-CM | POA: Diagnosis not present

## 2016-01-22 DIAGNOSIS — Z96652 Presence of left artificial knee joint: Secondary | ICD-10-CM | POA: Diagnosis not present

## 2016-01-22 DIAGNOSIS — M1712 Unilateral primary osteoarthritis, left knee: Secondary | ICD-10-CM | POA: Diagnosis not present

## 2016-01-22 DIAGNOSIS — M25562 Pain in left knee: Secondary | ICD-10-CM | POA: Diagnosis not present

## 2016-01-22 DIAGNOSIS — M25662 Stiffness of left knee, not elsewhere classified: Secondary | ICD-10-CM | POA: Diagnosis not present

## 2016-01-24 DIAGNOSIS — Z96652 Presence of left artificial knee joint: Secondary | ICD-10-CM | POA: Diagnosis not present

## 2016-01-24 DIAGNOSIS — M1712 Unilateral primary osteoarthritis, left knee: Secondary | ICD-10-CM | POA: Diagnosis not present

## 2016-01-24 DIAGNOSIS — M25562 Pain in left knee: Secondary | ICD-10-CM | POA: Diagnosis not present

## 2016-01-24 DIAGNOSIS — M25662 Stiffness of left knee, not elsewhere classified: Secondary | ICD-10-CM | POA: Diagnosis not present

## 2016-01-25 DIAGNOSIS — M25662 Stiffness of left knee, not elsewhere classified: Secondary | ICD-10-CM | POA: Diagnosis not present

## 2016-01-25 DIAGNOSIS — M25562 Pain in left knee: Secondary | ICD-10-CM | POA: Diagnosis not present

## 2016-01-25 DIAGNOSIS — M1712 Unilateral primary osteoarthritis, left knee: Secondary | ICD-10-CM | POA: Diagnosis not present

## 2016-01-25 DIAGNOSIS — Z96652 Presence of left artificial knee joint: Secondary | ICD-10-CM | POA: Diagnosis not present

## 2016-01-30 DIAGNOSIS — Z96652 Presence of left artificial knee joint: Secondary | ICD-10-CM | POA: Diagnosis not present

## 2016-01-30 DIAGNOSIS — M25662 Stiffness of left knee, not elsewhere classified: Secondary | ICD-10-CM | POA: Diagnosis not present

## 2016-01-30 DIAGNOSIS — M1712 Unilateral primary osteoarthritis, left knee: Secondary | ICD-10-CM | POA: Diagnosis not present

## 2016-01-30 DIAGNOSIS — M25562 Pain in left knee: Secondary | ICD-10-CM | POA: Diagnosis not present

## 2016-02-01 DIAGNOSIS — Z96652 Presence of left artificial knee joint: Secondary | ICD-10-CM | POA: Diagnosis not present

## 2016-02-01 DIAGNOSIS — M25562 Pain in left knee: Secondary | ICD-10-CM | POA: Diagnosis not present

## 2016-02-01 DIAGNOSIS — M25662 Stiffness of left knee, not elsewhere classified: Secondary | ICD-10-CM | POA: Diagnosis not present

## 2016-02-01 DIAGNOSIS — M1712 Unilateral primary osteoarthritis, left knee: Secondary | ICD-10-CM | POA: Diagnosis not present

## 2016-02-06 DIAGNOSIS — M25562 Pain in left knee: Secondary | ICD-10-CM | POA: Diagnosis not present

## 2016-02-06 DIAGNOSIS — M25662 Stiffness of left knee, not elsewhere classified: Secondary | ICD-10-CM | POA: Diagnosis not present

## 2016-02-06 DIAGNOSIS — M1712 Unilateral primary osteoarthritis, left knee: Secondary | ICD-10-CM | POA: Diagnosis not present

## 2016-02-06 DIAGNOSIS — Z96652 Presence of left artificial knee joint: Secondary | ICD-10-CM | POA: Diagnosis not present

## 2016-02-08 DIAGNOSIS — M25562 Pain in left knee: Secondary | ICD-10-CM | POA: Diagnosis not present

## 2016-02-08 DIAGNOSIS — M1712 Unilateral primary osteoarthritis, left knee: Secondary | ICD-10-CM | POA: Diagnosis not present

## 2016-02-08 DIAGNOSIS — M25662 Stiffness of left knee, not elsewhere classified: Secondary | ICD-10-CM | POA: Diagnosis not present

## 2016-02-08 DIAGNOSIS — Z96652 Presence of left artificial knee joint: Secondary | ICD-10-CM | POA: Diagnosis not present

## 2016-02-13 DIAGNOSIS — M25562 Pain in left knee: Secondary | ICD-10-CM | POA: Diagnosis not present

## 2016-02-13 DIAGNOSIS — M1712 Unilateral primary osteoarthritis, left knee: Secondary | ICD-10-CM | POA: Diagnosis not present

## 2016-02-13 DIAGNOSIS — M25662 Stiffness of left knee, not elsewhere classified: Secondary | ICD-10-CM | POA: Diagnosis not present

## 2016-02-13 DIAGNOSIS — Z96652 Presence of left artificial knee joint: Secondary | ICD-10-CM | POA: Diagnosis not present

## 2016-02-15 DIAGNOSIS — Z96652 Presence of left artificial knee joint: Secondary | ICD-10-CM | POA: Diagnosis not present

## 2016-02-15 DIAGNOSIS — M1712 Unilateral primary osteoarthritis, left knee: Secondary | ICD-10-CM | POA: Diagnosis not present

## 2016-02-15 DIAGNOSIS — M25662 Stiffness of left knee, not elsewhere classified: Secondary | ICD-10-CM | POA: Diagnosis not present

## 2016-02-15 DIAGNOSIS — M25562 Pain in left knee: Secondary | ICD-10-CM | POA: Diagnosis not present

## 2016-02-20 DIAGNOSIS — Z96652 Presence of left artificial knee joint: Secondary | ICD-10-CM | POA: Diagnosis not present

## 2016-02-20 DIAGNOSIS — M25662 Stiffness of left knee, not elsewhere classified: Secondary | ICD-10-CM | POA: Diagnosis not present

## 2016-02-20 DIAGNOSIS — M25562 Pain in left knee: Secondary | ICD-10-CM | POA: Diagnosis not present

## 2016-02-20 DIAGNOSIS — M1712 Unilateral primary osteoarthritis, left knee: Secondary | ICD-10-CM | POA: Diagnosis not present

## 2016-02-22 DIAGNOSIS — M25562 Pain in left knee: Secondary | ICD-10-CM | POA: Diagnosis not present

## 2016-02-22 DIAGNOSIS — M25662 Stiffness of left knee, not elsewhere classified: Secondary | ICD-10-CM | POA: Diagnosis not present

## 2016-02-22 DIAGNOSIS — M1712 Unilateral primary osteoarthritis, left knee: Secondary | ICD-10-CM | POA: Diagnosis not present

## 2016-02-22 DIAGNOSIS — Z96652 Presence of left artificial knee joint: Secondary | ICD-10-CM | POA: Diagnosis not present

## 2016-02-23 ENCOUNTER — Encounter: Payer: Self-pay | Admitting: Family Medicine

## 2016-02-23 ENCOUNTER — Ambulatory Visit (INDEPENDENT_AMBULATORY_CARE_PROVIDER_SITE_OTHER): Payer: BLUE CROSS/BLUE SHIELD | Admitting: Family Medicine

## 2016-02-23 VITALS — BP 129/78 | HR 87 | Temp 98.5°F | Ht 63.0 in | Wt 224.3 lb

## 2016-02-23 DIAGNOSIS — E669 Obesity, unspecified: Secondary | ICD-10-CM | POA: Diagnosis not present

## 2016-02-23 DIAGNOSIS — R635 Abnormal weight gain: Secondary | ICD-10-CM

## 2016-02-23 DIAGNOSIS — Z0000 Encounter for general adult medical examination without abnormal findings: Secondary | ICD-10-CM

## 2016-02-23 LAB — BASIC METABOLIC PANEL
BUN: 12 mg/dL (ref 7–25)
CO2: 24 mmol/L (ref 20–31)
CREATININE: 0.63 mg/dL (ref 0.50–1.10)
Calcium: 9.7 mg/dL (ref 8.6–10.2)
Chloride: 104 mmol/L (ref 98–110)
Glucose, Bld: 95 mg/dL (ref 65–99)
Potassium: 4.4 mmol/L (ref 3.5–5.3)
Sodium: 138 mmol/L (ref 135–146)

## 2016-02-23 LAB — TSH: TSH: 1.02 m[IU]/L

## 2016-02-23 LAB — POCT GLYCOSYLATED HEMOGLOBIN (HGB A1C): HEMOGLOBIN A1C: 4.9

## 2016-02-23 MED ORDER — ORLISTAT 120 MG PO CAPS: 120.0000 mg | ORAL_CAPSULE | Freq: Three times a day (TID) | ORAL | Status: DC

## 2016-02-23 NOTE — Patient Instructions (Addendum)
Make an appointment later this summer for a pap smear.    We're checking your blood sugar and thyroid today.   I have prescribed you Orlistat for weight loss.  Let's see how this works for the next 12 weeks.  Take this 3 times a day with meals. If you miss a meal or have a meal without much fat (for example a salad without much added) then you can skip that dose.    Keep track of your weight, weigh yourself at the same time every week.  Write this down.  Bring your log when you come back.  If you can't tolerate the medicine or want to talk to nutrition, call and let me know.  It was good to see you again today.
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
## 2016-02-23 NOTE — Assessment & Plan Note (Signed)
Discussed coming back for Pap smear later this year.

## 2016-02-23 NOTE — Assessment & Plan Note (Addendum)
She is doing on the right things as far as trying to lose weight her home. She has changed her diet. Eating more fruits and vegetables. She has cut out sweets. She is also exercising more since her knee replacement surgery about 6 weeks ago. However she still is having trouble losing weight. She is up to about 224 pounds today. At the end of 2016 she was in the 180-190 range.. She is not had much mobility recently secondary to her knee replacement. Starting orlistat today. See instructions below. She is to call she cannot tolerate this. She is also to call if she would like a nutrition referral. She like to try to continue to navigate food choices on her own for now.  Rechecking thyroid function today. Also checking blood sugars that she's had some increased nocturia at nighttime. No dysuria. Doesn't sound like UTI symptoms.

## 2016-02-23 NOTE — Addendum Note (Signed)
Addended by: Tacy Dura RUMPLE, Janita Camberos D on: 02/23/2016 11:04 AM   Modules accepted: Orders

## 2016-02-27 ENCOUNTER — Encounter: Payer: Self-pay | Admitting: Family Medicine

## 2016-02-27 DIAGNOSIS — Z96652 Presence of left artificial knee joint: Secondary | ICD-10-CM | POA: Diagnosis not present

## 2016-02-27 DIAGNOSIS — M25562 Pain in left knee: Secondary | ICD-10-CM | POA: Diagnosis not present

## 2016-02-27 DIAGNOSIS — M25662 Stiffness of left knee, not elsewhere classified: Secondary | ICD-10-CM | POA: Diagnosis not present

## 2016-02-27 DIAGNOSIS — M1712 Unilateral primary osteoarthritis, left knee: Secondary | ICD-10-CM | POA: Diagnosis not present

## 2016-02-28 ENCOUNTER — Telehealth: Payer: Self-pay

## 2016-02-28 NOTE — Telephone Encounter (Signed)
-----   Message from Alveda Reasons, MD sent at 02/27/2016  2:14 PM EDT ----- Regarding: Print and send letter I tried to create a letter for this patient's labs, but it wouldn't let me route it.  Can one of you print this and have it mailed?  Thanks so much!  JW

## 2016-02-28 NOTE — Telephone Encounter (Signed)
Printed and mailed. Ladeja Pelham T Lataisha Colan, CMA   

## 2016-02-29 DIAGNOSIS — M25662 Stiffness of left knee, not elsewhere classified: Secondary | ICD-10-CM | POA: Diagnosis not present

## 2016-02-29 DIAGNOSIS — Z96652 Presence of left artificial knee joint: Secondary | ICD-10-CM | POA: Diagnosis not present

## 2016-02-29 DIAGNOSIS — M25562 Pain in left knee: Secondary | ICD-10-CM | POA: Diagnosis not present

## 2016-02-29 DIAGNOSIS — M1712 Unilateral primary osteoarthritis, left knee: Secondary | ICD-10-CM | POA: Diagnosis not present

## 2016-03-06 DIAGNOSIS — M25562 Pain in left knee: Secondary | ICD-10-CM | POA: Diagnosis not present

## 2016-03-06 DIAGNOSIS — M25662 Stiffness of left knee, not elsewhere classified: Secondary | ICD-10-CM | POA: Diagnosis not present

## 2016-03-06 DIAGNOSIS — Z96652 Presence of left artificial knee joint: Secondary | ICD-10-CM | POA: Diagnosis not present

## 2016-03-06 DIAGNOSIS — M1712 Unilateral primary osteoarthritis, left knee: Secondary | ICD-10-CM | POA: Diagnosis not present

## 2016-03-12 DIAGNOSIS — Z96652 Presence of left artificial knee joint: Secondary | ICD-10-CM | POA: Diagnosis not present

## 2016-03-12 DIAGNOSIS — M25662 Stiffness of left knee, not elsewhere classified: Secondary | ICD-10-CM | POA: Diagnosis not present

## 2016-03-12 DIAGNOSIS — M1712 Unilateral primary osteoarthritis, left knee: Secondary | ICD-10-CM | POA: Diagnosis not present

## 2016-03-12 DIAGNOSIS — M25562 Pain in left knee: Secondary | ICD-10-CM | POA: Diagnosis not present

## 2016-03-14 DIAGNOSIS — M1712 Unilateral primary osteoarthritis, left knee: Secondary | ICD-10-CM | POA: Diagnosis not present

## 2016-03-14 DIAGNOSIS — M25562 Pain in left knee: Secondary | ICD-10-CM | POA: Diagnosis not present

## 2016-03-14 DIAGNOSIS — Z96652 Presence of left artificial knee joint: Secondary | ICD-10-CM | POA: Diagnosis not present

## 2016-03-14 DIAGNOSIS — M25662 Stiffness of left knee, not elsewhere classified: Secondary | ICD-10-CM | POA: Diagnosis not present

## 2016-03-15 DIAGNOSIS — M1712 Unilateral primary osteoarthritis, left knee: Secondary | ICD-10-CM | POA: Diagnosis not present

## 2016-05-08 ENCOUNTER — Other Ambulatory Visit: Payer: Self-pay | Admitting: Family Medicine

## 2016-05-09 ENCOUNTER — Telehealth: Payer: Self-pay | Admitting: Family Medicine

## 2016-05-09 NOTE — Telephone Encounter (Signed)
**  After Hours/ Emergency Line Call*  Received a call from patient [name].  She reports that she had a left knee replacement in March.  She reports that she fell in the shower on Monday.  She notes since then her left leg has been bothering her, reporting pain with ambulation.  She reports tingling in her foot and now her left leg feels cold to touch from the knee down.  Denying cyanosis/ discoloration.    Recommended that she be evaluated.  Discussed that if she is having numbness that is not relieved by moving/ changing positions and that she is feeling a notable difference in Amber temperatures, she should be evaluated this evening in the ED.  Otherwise, could consider evaluation in am.  Concern for possible vascular compromise given history, though it is difficult to determine if her foot is simply asleep.  Red flags discussed.  Will forward to PCP.  Cariana Karge M. Lajuana Ripple, DO PGY-3, [facility]

## 2016-06-04 DIAGNOSIS — M1712 Unilateral primary osteoarthritis, left knee: Secondary | ICD-10-CM | POA: Diagnosis not present

## 2016-07-04 ENCOUNTER — Other Ambulatory Visit: Payer: Self-pay | Admitting: *Deleted

## 2016-07-04 MED ORDER — ORLISTAT 120 MG PO CAPS: ORAL_CAPSULE | ORAL | 1 refills | Status: DC

## 2016-07-04 NOTE — Telephone Encounter (Signed)
Refill request for 90 day supply.  Romelia Bromell L, RN  

## 2016-08-08 DIAGNOSIS — Z23 Encounter for immunization: Secondary | ICD-10-CM | POA: Diagnosis not present

## 2016-09-13 DIAGNOSIS — M1712 Unilateral primary osteoarthritis, left knee: Secondary | ICD-10-CM | POA: Diagnosis not present

## 2016-09-20 DIAGNOSIS — M1711 Unilateral primary osteoarthritis, right knee: Secondary | ICD-10-CM | POA: Diagnosis not present

## 2016-09-27 DIAGNOSIS — M1711 Unilateral primary osteoarthritis, right knee: Secondary | ICD-10-CM | POA: Diagnosis not present

## 2016-10-04 DIAGNOSIS — M1711 Unilateral primary osteoarthritis, right knee: Secondary | ICD-10-CM | POA: Diagnosis not present

## 2016-12-19 ENCOUNTER — Ambulatory Visit: Payer: Self-pay | Admitting: Family Medicine

## 2017-01-16 NOTE — Progress Notes (Signed)
Pre visit review using our clinic review tool, if applicable. No additional management support is needed unless otherwise documented below in the visit note. 

## 2017-01-16 NOTE — Patient Instructions (Addendum)
  A few things to remember from today's visit:   Neoplasm of soft tissue of hand - Plan: Ambulatory referral to Orthopedic Surgery  Dyslipidemia - Plan: Lipid panel, Hemoglobin A1c  Obesity, Class II, BMI 35-39.9 - Plan: buPROPion (WELLBUTRIN SR) 150 MG 12 hr tablet, Basic metabolic panel, TSH, Hemoglobin A1c  What are some tips for weight loss? People become overweight for many reasons. Weight issues can run in families. They can be caused by unhealthy behaviors and a person's environment. Certain health problems and medicines can also lead to weight gain. There are some simple things you can do to reach and maintain a healthy weight:  Eat small more frequent healthy meals instead 3 bid meals. Also Weight Watchers is a good option. Avoid sweet drinks. These include regular soft drinks, fruit juices, fruit drinks, energy drinks, sweetened iced tea, and flavored milk. Avoid fast foods. Fast foods such as french fries, hamburgers, chicken nuggets, and pizza are high in calories and can cause weight gain. Eat a healthy breakfast. People who skip breakfast tend to weigh more. Don't watch more than two hours of television per day. Chew sugar-free gum between meals to cut down on snacking. Avoid grocery shopping when you're hungry. Pack a healthy lunch instead of eating out to control what and how much you eat. Eat a lot of fruits and vegetables. Aim for about 2 cups of fruit and 2 to 3 cups of vegetables per day. Aim for 150 minutes per week of moderate-intensity exercise (such as brisk walking), or 75 minutes per week of vigorous exercise (such as jogging or running). OR 15-30 min of daily brisk walking. Be more active. Small changes in physical activity can easily be added to your daily routine. For example, take the stairs instead of the elevator. Take a walk with your family. A daily walk is a great way to get exercise and to catch up on the day's events.   Please be sure medication list is  accurate. If a new problem present, please set up appointment sooner than planned today.         WE NOW OFFER   [facility] FAST TRACK!!!  SAME DAY Appointments for ACUTE CARE  Such as: Sprains, Injuries, cuts, abrasions, rashes, muscle pain, joint pain, back pain Colds, flu, sore throats, headache, allergies, cough, fever  Ear pain, sinus and eye infections Abdominal pain, nausea, vomiting, diarrhea, upset stomach Animal/insect bites  3 Easy Ways to Schedule: Walk-In Scheduling Call in scheduling Mychart Sign-up: [email or web address]

## 2017-01-16 NOTE — Progress Notes (Signed)
HPI:   Ms.Butler Butler is a 41 y.o. female, who is here today to establish care.  Former PCP: Dr Butler Butler. Last preventive routine visit: 02/2016  Chronic medical problems: Asthma,obesity,knee pain  Asthma and allergic rhinitis usually worse around summer. She uses albuterol inhaler as needed and Flonase nasal spray.   Concerns today: Obesity, lesion in hand.  Today she would like to discussed pharmacologic treatment for weight loss. She has been on Xenical in the past but discontinued because it caused epigastric pain, she tried 2 times.  She does exercise some, 2-3 times per week she tries to go to the gym to do the elliptical, when she cannot do so because her schedule, she tries to walk. She has been more consistent with physical activity for the past 3-4 months.  In regard to her diet, she decreased amount sweet tea intake, she doesn't count calories. Usually breakfast and lunch are small, her dinner is her biggest meal. During the week she usually cooks at home, she eats out during weekends. She has lost about 11 pounds in the past 6 months. She denies any history of diabetes.   She is concerned about thyroid disease, states that her mom and other relatives have hypothyroidism.   She has history of dyslipidemia.  Lab Results  Component Value Date   CHOL  02/14/2009    126        ATP III CLASSIFICATION:  <200     mg/dL   Desirable  200-239  mg/dL   Borderline High  >=240    mg/dL   High          HDL 31 (L) 02/14/2009   LDLCALC  02/14/2009    82        Total Cholesterol/HDL:CHD Risk Coronary Heart Disease Risk Table                     Men   Women  1/2 Average Risk   3.4   3.3  Average Risk       5.0   4.4  2 X Average Risk   9.6   7.1  3 X Average Risk  23.4   11.0        Use the calculated Patient Ratio above and the CHD Risk Table to determine the patient's CHD Risk.        ATP III CLASSIFICATION (LDL):  <100     mg/dL   Optimal  100-129  mg/dL    Near or Above                    Optimal  130-159  mg/dL   Borderline  160-189  mg/dL   High  >190     mg/dL   Very High   TRIG 63 02/14/2009   CHOLHDL 4.1 02/14/2009    History of anxiety and has past history of postpartum depression. She denies suicidal ideation. Former smoker. History of insomnia, she had difficulty falling asleep and staying asleep, she takes OTC sleep aids.  -She is also complaining of lesion in right hand, she noted it "a while ago." It seems to be growing, she has some pain which is exacerbated by repetitive activities like typing and relieved by rest. She denies any history of trauma and has no limitation of range of motion.  Hx of bilateral knee pain,S/P left TKR, according to patient, he has been recommended to also have surgery of right knee but  she would like to put it off as much as she can. She follows with orthopedist periodically.    Review of Systems  Constitutional: Positive for fatigue (no more than usual). Negative for activity change, appetite change, fever and unexpected weight change.  HENT: Positive for postnasal drip. Negative for mouth sores, nosebleeds and trouble swallowing.   Eyes: Negative for redness and visual disturbance.  Respiratory: Negative for cough, shortness of breath and wheezing.   Cardiovascular: Negative for chest pain, palpitations and leg swelling.  Gastrointestinal: Negative for abdominal pain, nausea and vomiting.       Negative for changes in bowel habits.  Endocrine: Negative for cold intolerance, heat intolerance, polydipsia, polyphagia and polyuria.  Genitourinary: Negative for decreased urine volume and hematuria.  Musculoskeletal: Positive for arthralgias. Negative for gait problem.  Skin: Negative for rash and wound.  Allergic/Immunologic: Positive for environmental allergies.  Neurological: Negative for syncope, weakness and headaches.  Psychiatric/Behavioral: Positive for sleep disturbance. Negative for  confusion and suicidal ideas. The patient is nervous/anxious.       Current Outpatient Prescriptions on File Prior to Visit  Medication Sig Dispense Refill  . albuterol (VENTOLIN HFA) 108 (90 Base) MCG/ACT inhaler Inhale 2 puffs into the lungs 4 (four) times daily as needed. For cough 1 Inhaler 1  . cetirizine (ZYRTEC) 10 MG chewable tablet Chew 10 mg by mouth daily.    Marland Kitchen EPIPEN 2-PAK 0.3 MG/0.3ML SOAJ injection INJECT 0.3 MLS INTO THE MUSCLE ONCE AS NEEDED FOR SEVERE ALLERGIC REACTION 2 Device 0  . fluticasone (FLONASE) 50 MCG/ACT nasal spray Place 2 sprays into both nostrils daily. 16 g 6  . orlistat (XENICAL) 120 MG capsule TAKE ONE CAPSULE BY MOUTH 3 TIMES A DAY WITH MEALS 90 capsule 1   No current facility-administered medications on file prior to visit.      Past Medical History:  Diagnosis Date  . Allergy    runny nose 07/29/2013  . Anxiety   . Arthritis   . Asthma    daily and prn inhalers  . Carpal tunnel syndrome of right wrist 03/2012  . De Quervain's tenosynovitis, right 07/2013  . Family history of anesthesia complication    pt's mother has hx. of post-op N/V  . History of seizure    as a reaction to Dilaudid  . PONV (postoperative nausea and vomiting)    nausea only  . TIA (transient ischemic attack) 2009  . Trigger thumb of left hand 03/2012  . Wears partial dentures    upper   Allergies  Allergen Reactions  . Bee Venom Anaphylaxis  . Dilaudid [Hydromorphone Hcl] Other (See Comments)    SEIZURE   . Coconut Oil Rash  . Codeine Itching and Rash  . Hydrocodone Itching and Rash    Family History  Problem Relation Age of Onset  . Anesthesia problems Mother     post-op N/V  . Thyroid disease Mother   . Stroke Mother     TIA  . Diabetes Neg Hx   . Hypertension Neg Hx     Social History   Social History  . Marital status: Married    Spouse name: N/A  . Number of children: N/A  . Years of education: N/A   Social History Main Topics  . Smoking  status: Former Smoker    Years: 10.00    Quit date: 01/22/1999  . Smokeless tobacco: Never Used  . Alcohol use No     Comment: no alcohol in 3 yrs.  Marland Kitchen  Drug use: No  . Sexual activity: Yes    Birth control/ protection: Surgical   Other Topics Concern  . None   Social History Narrative  . None    Vitals:   01/16/17 1541  BP: 130/90  Pulse: 87  Resp: 12  O2 sat 98% at RA.  Body mass index is 39.75 kg/m.   Physical Exam  Nursing note and vitals reviewed. Constitutional: She is oriented to person, place, and time. She appears well-developed. No distress.  HENT:  Head: Atraumatic.  Mouth/Throat: Oropharynx is clear and moist and mucous membranes are normal.  Eyes: Conjunctivae and EOM are normal. Pupils are equal, round, and reactive to light.  Neck: No tracheal deviation present. Thyromegaly (mild) present. No thyroid mass present.  Cardiovascular: Normal rate and regular rhythm.   No murmur heard. Pulses:      Dorsalis pedis pulses are 2+ on the right side, and 2+ on the left side.  Respiratory: Effort normal and breath sounds normal. No respiratory distress.  GI: Soft. She exhibits no mass. There is no hepatomegaly. There is no tenderness.  Musculoskeletal: She exhibits no edema.       Hands: 1.5-2 cm,some mobile,mildly tender,nodular lesion palpated in between 3rd and 4th MCP joints right hand.No limitation of ROM and no skin changes appreciated.  Lymphadenopathy:    She has no cervical adenopathy.  Neurological: She is alert and oriented to person, place, and time. She has normal strength. Coordination and gait normal.  Skin: Skin is warm. No erythema.  Psychiatric: Her mood appears anxious.  Well groomed, good eye contact.     ASSESSMENT AND PLAN:   Makya was seen today for establish care.  Diagnoses and all orders for this visit:  Neoplasm of soft tissue of hand  Most likely benign, lesion is growing and causing some discomfort. Referral to orthopedist,  hand specialist, placed.  -     Ambulatory referral to Orthopedic Surgery  Dyslipidemia  Low-fat diet discussed. Small amount of wine, 2 Oz, a few times per week might help to increase HDL. Further recommendations will be given according to lab results.  -     Lipid panel -     Hemoglobin A1c  Obesity, Class II, BMI 35-39.9  We discussed benefits of wt loss as well as adverse effects of obesity. Consistency with healthy diet and physical activity recommended. Weight Watchers is a good option as well as daily brisk walking for 15-30 min as tolerated. We also discussed some options in regard to pharmacologic treatment, I think he will benefit from Wellbutrin given her history of anxiety.  -     buPROPion (WELLBUTRIN SR) 150 MG 12 hr tablet; Take 1 tablet (150 mg total) by mouth 2 (two) times daily. -     Basic metabolic panel -     TSH -     Hemoglobin A1c  Elevated blood pressure reading  We will continue monitoring. Weight loss, regular physical activity, and low salt diet recommended.  Anxiety disorder, unspecified type  Wellbutrin might also help with problem.  Recommended starting Wellbutrin once daily for the first week and increase to twice daily the second week if tolerating well.  We discussed some side effects. Follow-up in 6 weeks.     Kadin Canipe G. [place], MD  [facility]. [place] office.

## 2017-01-17 ENCOUNTER — Encounter: Payer: Self-pay | Admitting: Family Medicine

## 2017-01-17 ENCOUNTER — Ambulatory Visit (INDEPENDENT_AMBULATORY_CARE_PROVIDER_SITE_OTHER): Payer: BLUE CROSS/BLUE SHIELD | Admitting: Family Medicine

## 2017-01-17 VITALS — BP 130/90 | HR 87 | Resp 12 | Ht 63.0 in | Wt 224.4 lb

## 2017-01-17 DIAGNOSIS — E785 Hyperlipidemia, unspecified: Secondary | ICD-10-CM | POA: Diagnosis not present

## 2017-01-17 DIAGNOSIS — D492 Neoplasm of unspecified behavior of bone, soft tissue, and skin: Secondary | ICD-10-CM | POA: Diagnosis not present

## 2017-01-17 DIAGNOSIS — E669 Obesity, unspecified: Secondary | ICD-10-CM

## 2017-01-17 DIAGNOSIS — R030 Elevated blood-pressure reading, without diagnosis of hypertension: Secondary | ICD-10-CM | POA: Diagnosis not present

## 2017-01-17 DIAGNOSIS — F419 Anxiety disorder, unspecified: Secondary | ICD-10-CM

## 2017-01-17 LAB — BASIC METABOLIC PANEL
BUN: 14 mg/dL (ref 6–23)
CALCIUM: 9.8 mg/dL (ref 8.4–10.5)
CO2: 25 mEq/L (ref 19–32)
CREATININE: 0.68 mg/dL (ref 0.40–1.20)
Chloride: 105 mEq/L (ref 96–112)
GFR: 101.6 mL/min (ref >60.00)
Glucose, Bld: 103 mg/dL — ABNORMAL HIGH (ref 70–99)
Potassium: 4.4 mEq/L (ref 3.5–5.1)
SODIUM: 137 meq/L (ref 135–145)

## 2017-01-17 LAB — TSH: TSH: 0.73 u[IU]/mL (ref 0.35–4.50)

## 2017-01-17 LAB — LIPID PANEL
CHOLESTEROL: 141 mg/dL (ref 0–200)
HDL: 41.9 mg/dL (ref >39.00)
LDL Cholesterol: 85 mg/dL (ref 0–99)
NonHDL: 99.14
TRIGLYCERIDES: 71 mg/dL (ref 0.0–149.0)
Total CHOL/HDL Ratio: 3
VLDL: 14.2 mg/dL (ref 0.0–40.0)

## 2017-01-17 LAB — HEMOGLOBIN A1C: HEMOGLOBIN A1C: 5.2 % (ref 4.6–6.5)

## 2017-01-17 MED ORDER — BUPROPION HCL ER (SR) 150 MG PO TB12: 150.0000 mg | ORAL_TABLET | Freq: Two times a day (BID) | ORAL | 1 refills | Status: DC

## 2017-01-19 ENCOUNTER — Encounter: Payer: Self-pay | Admitting: Family Medicine

## 2017-01-27 ENCOUNTER — Ambulatory Visit (INDEPENDENT_AMBULATORY_CARE_PROVIDER_SITE_OTHER): Payer: Self-pay | Admitting: Orthopaedic Surgery

## 2017-01-28 ENCOUNTER — Ambulatory Visit (INDEPENDENT_AMBULATORY_CARE_PROVIDER_SITE_OTHER): Payer: Self-pay | Admitting: Orthopaedic Surgery

## 2017-02-10 ENCOUNTER — Ambulatory Visit (INDEPENDENT_AMBULATORY_CARE_PROVIDER_SITE_OTHER): Payer: Self-pay | Admitting: Orthopaedic Surgery

## 2017-02-12 ENCOUNTER — Other Ambulatory Visit: Payer: Self-pay

## 2017-02-12 DIAGNOSIS — E669 Obesity, unspecified: Secondary | ICD-10-CM

## 2017-02-12 MED ORDER — BUPROPION HCL ER (SR) 150 MG PO TB12: 150.0000 mg | ORAL_TABLET | Freq: Two times a day (BID) | ORAL | 0 refills | Status: DC

## 2017-02-28 ENCOUNTER — Ambulatory Visit: Payer: BLUE CROSS/BLUE SHIELD | Admitting: Family Medicine

## 2017-06-26 ENCOUNTER — Encounter: Payer: Self-pay | Admitting: Family Medicine

## 2017-07-28 DIAGNOSIS — Z23 Encounter for immunization: Secondary | ICD-10-CM | POA: Diagnosis not present

## 2017-10-03 DIAGNOSIS — J0140 Acute pansinusitis, unspecified: Secondary | ICD-10-CM | POA: Diagnosis not present

## 2017-11-10 DIAGNOSIS — J019 Acute sinusitis, unspecified: Secondary | ICD-10-CM | POA: Diagnosis not present

## 2017-11-10 DIAGNOSIS — B9689 Other specified bacterial agents as the cause of diseases classified elsewhere: Secondary | ICD-10-CM | POA: Diagnosis not present

## 2017-11-22 ENCOUNTER — Encounter (HOSPITAL_COMMUNITY): Payer: Self-pay

## 2017-11-22 ENCOUNTER — Other Ambulatory Visit: Payer: Self-pay

## 2017-11-22 ENCOUNTER — Emergency Department (HOSPITAL_COMMUNITY)
Admission: EM | Admit: 2017-11-22 | Discharge: 2017-11-22 | Disposition: A | Discharge status: Home / Self Care | Payer: BLUE CROSS/BLUE SHIELD | Attending: Emergency Medicine | Admitting: Emergency Medicine

## 2017-11-22 ENCOUNTER — Emergency Department (HOSPITAL_COMMUNITY): Payer: BLUE CROSS/BLUE SHIELD

## 2017-11-22 DIAGNOSIS — Z79899 Other long term (current) drug therapy: Secondary | ICD-10-CM | POA: Insufficient documentation

## 2017-11-22 DIAGNOSIS — Z87891 Personal history of nicotine dependence: Secondary | ICD-10-CM | POA: Insufficient documentation

## 2017-11-22 DIAGNOSIS — K644 Residual hemorrhoidal skin tags: Secondary | ICD-10-CM

## 2017-11-22 DIAGNOSIS — K922 Gastrointestinal hemorrhage, unspecified: Secondary | ICD-10-CM

## 2017-11-22 DIAGNOSIS — J45909 Unspecified asthma, uncomplicated: Secondary | ICD-10-CM | POA: Insufficient documentation

## 2017-11-22 DIAGNOSIS — K921 Melena: Secondary | ICD-10-CM | POA: Diagnosis not present

## 2017-11-22 DIAGNOSIS — R197 Diarrhea, unspecified: Secondary | ICD-10-CM

## 2017-11-22 LAB — TYPE AND SCREEN
ABO/RH(D): A POS
Antibody Screen: NEGATIVE

## 2017-11-22 LAB — CBC
HCT: 42.1 % (ref 36.0–46.0)
Hemoglobin: 14.2 g/dL (ref 12.0–15.0)
MCH: 29.5 pg (ref 26.0–34.0)
MCHC: 33.7 g/dL (ref 30.0–36.0)
MCV: 87.3 fL (ref 78.0–100.0)
Platelets: 243 K/uL (ref 150–400)
RBC: 4.82 MIL/uL (ref 3.87–5.11)
RDW: 12.6 % (ref 11.5–15.5)
WBC: 8.6 K/uL (ref 4.0–10.5)

## 2017-11-22 LAB — COMPREHENSIVE METABOLIC PANEL
ALBUMIN: 3.8 g/dL (ref 3.5–5.0)
ALK PHOS: 109 U/L (ref 38–126)
ALT: 22 U/L (ref 14–54)
ANION GAP: 9 (ref 5–15)
AST: 23 U/L (ref 15–41)
BUN: 9 mg/dL (ref 6–20)
CHLORIDE: 108 mmol/L (ref 101–111)
CO2: 21 mmol/L — AB (ref 22–32)
Calcium: 9.6 mg/dL (ref 8.9–10.3)
Creatinine, Ser: 0.71 mg/dL (ref 0.44–1.00)
GFR calc Af Amer: >60 mL/min (ref >60)
GFR calc non Af Amer: >60 mL/min (ref >60)
GLUCOSE: 102 mg/dL — AB (ref 65–99)
POTASSIUM: 4.2 mmol/L (ref 3.5–5.1)
SODIUM: 138 mmol/L (ref 135–145)
TOTAL PROTEIN: 6.6 g/dL (ref 6.5–8.1)
Total Bilirubin: 0.7 mg/dL (ref 0.3–1.2)

## 2017-11-22 LAB — ABO/RH: ABO/RH(D): A POS

## 2017-11-22 LAB — I-STAT BETA HCG BLOOD, ED (MC, WL, AP ONLY): I-stat hCG, quantitative: <5 m[IU]/mL (ref <5)

## 2017-11-22 LAB — POC OCCULT BLOOD, ED: Fecal Occult Bld: POSITIVE — AB

## 2017-11-22 MED ORDER — IOPAMIDOL (ISOVUE-300) INJECTION 61%
INTRAVENOUS | Status: AC
Start: 2017-11-22 — End: 2017-11-22
  Administered 2017-11-22: 100 mL
  Filled 2017-11-22: qty 100

## 2017-11-22 MED ORDER — SODIUM CHLORIDE 0.9 % IV BOLUS (SEPSIS)
1000.0000 mL | Freq: Once | INTRAVENOUS | Status: AC
  Administered 2017-11-22: 1000 mL via INTRAVENOUS

## 2017-11-22 MED ORDER — FENTANYL CITRATE (PF) 100 MCG/2ML IJ SOLN
50.0000 ug | Freq: Once | INTRAMUSCULAR | Status: AC
  Administered 2017-11-22: 50 ug via INTRAVENOUS
  Filled 2017-11-22: qty 2

## 2017-11-22 NOTE — Discharge Instructions (Signed)
Although your rectal bleeding may be from hemorrhoid, please cal and follow up closely with GI specialist for further evaluation of your condition.  Return promptly if your condition worsen or if you have other concerns.

## 2017-11-22 NOTE — ED Notes (Signed)
Patient transported to CT 
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
## 2017-11-22 NOTE — ED Triage Notes (Signed)
Patient complains of abdominal discomfort with bright red rectal bleeding each time she goes to restroom. no nausea, no vomiting. Alert and oriented

## 2017-11-24 ENCOUNTER — Encounter: Payer: Self-pay | Admitting: Gastroenterology

## 2017-12-02 ENCOUNTER — Other Ambulatory Visit: Payer: Self-pay

## 2017-12-02 ENCOUNTER — Encounter: Payer: Self-pay | Admitting: Family Medicine

## 2017-12-02 ENCOUNTER — Ambulatory Visit: Payer: BLUE CROSS/BLUE SHIELD | Admitting: Family Medicine

## 2017-12-02 VITALS — BP 114/62 | HR 81 | Temp 98.7°F | Ht 63.0 in | Wt 237.0 lb

## 2017-12-02 DIAGNOSIS — Z713 Dietary counseling and surveillance: Secondary | ICD-10-CM | POA: Diagnosis not present

## 2017-12-02 DIAGNOSIS — K219 Gastro-esophageal reflux disease without esophagitis: Secondary | ICD-10-CM | POA: Diagnosis not present

## 2017-12-02 DIAGNOSIS — K922 Gastrointestinal hemorrhage, unspecified: Secondary | ICD-10-CM | POA: Diagnosis not present

## 2017-12-02 MED ORDER — OMEPRAZOLE 40 MG PO CPDR: 40.0000 mg | DELAYED_RELEASE_CAPSULE | Freq: Every day | ORAL | 3 refills | Status: DC

## 2017-12-02 NOTE — Patient Instructions (Signed)
It was good to see you again today.  Take the Prilosec daily for the next 6 weeks.  We'll see how things are going at that point.  I will refer you to Dr. Leafy Ro for weight loss.  If you're bleeding returns, be sure to follow up with GI.

## 2017-12-04 NOTE — Addendum Note (Signed)
Addended by: Alveda Reasons on: 12/04/2017 09:37 AM   Modules accepted: Orders
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
## 2017-12-29 ENCOUNTER — Ambulatory Visit: Payer: Self-pay | Admitting: Gastroenterology

## 2018-02-24 ENCOUNTER — Other Ambulatory Visit: Payer: Self-pay | Admitting: Family Medicine

## 2018-04-20 ENCOUNTER — Encounter (INDEPENDENT_AMBULATORY_CARE_PROVIDER_SITE_OTHER): Payer: Self-pay

## 2018-09-15 DIAGNOSIS — B9689 Other specified bacterial agents as the cause of diseases classified elsewhere: Secondary | ICD-10-CM | POA: Diagnosis not present

## 2018-09-15 DIAGNOSIS — J019 Acute sinusitis, unspecified: Secondary | ICD-10-CM | POA: Diagnosis not present

## 2018-09-16 ENCOUNTER — Ambulatory Visit: Payer: BLUE CROSS/BLUE SHIELD

## 2018-12-10 ENCOUNTER — Other Ambulatory Visit: Payer: Self-pay | Admitting: Family Medicine

## 2019-03-18 IMAGING — CT CT ABD-PELV W/ CM
2 of 5 series · 16 of 46 positions shown, 18 images · IV contrast (APPLIED)
Comparison: 07/21/2012

CLINICAL DATA: Blood in stool.

EXAM:
CT ABDOMEN AND PELVIS WITH CONTRAST
TECHNIQUE: Multidetector CT imaging of the abdomen and pelvis was performed
using the standard protocol following bolus administration of
intravenous contrast.
CONTRAST:  100mL RLFJJY-JHH IOPAMIDOL (RLFJJY-JHH) INJECTION 61%

[Series 3: abd/ pelvis 5.0 i30f 2 · axial · 0.72mm/px · z∈[+894,+1294]mm · 13 of 90 slices shown, 15 images]
[im 5/90  soft-tissue]
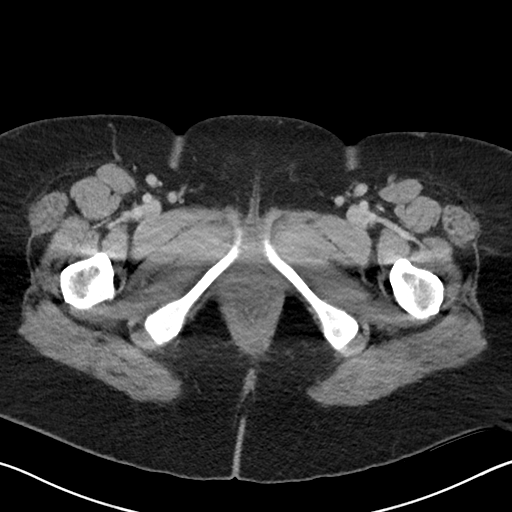
[im 5/90  bone]
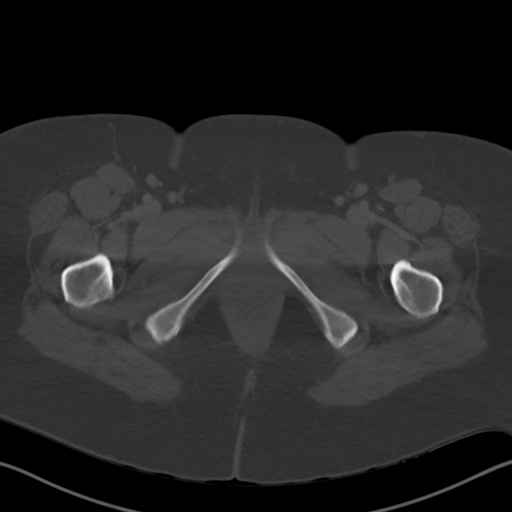
[im 15/90  soft-tissue]
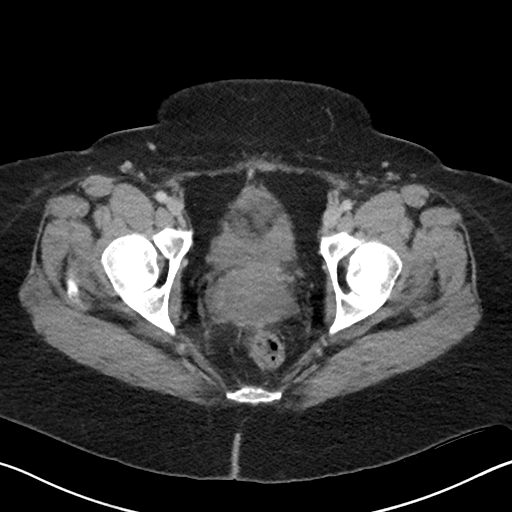
[im 19/90  soft-tissue]
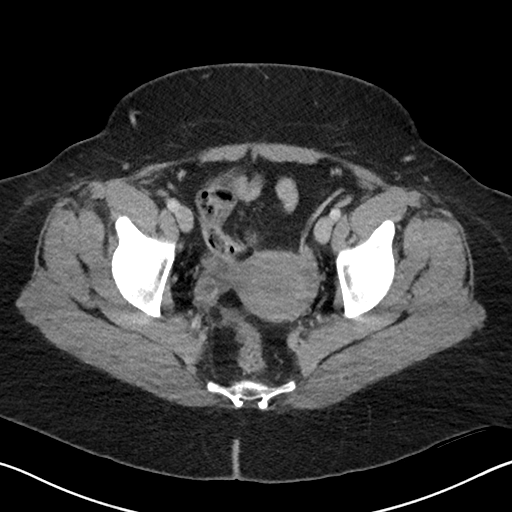
[im 24/90  soft-tissue]
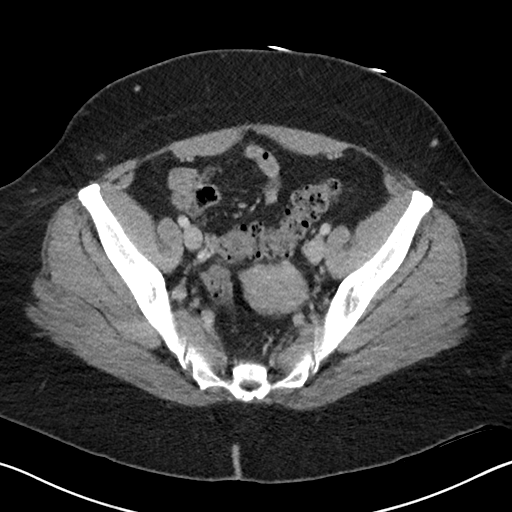
[im 33/90  soft-tissue]
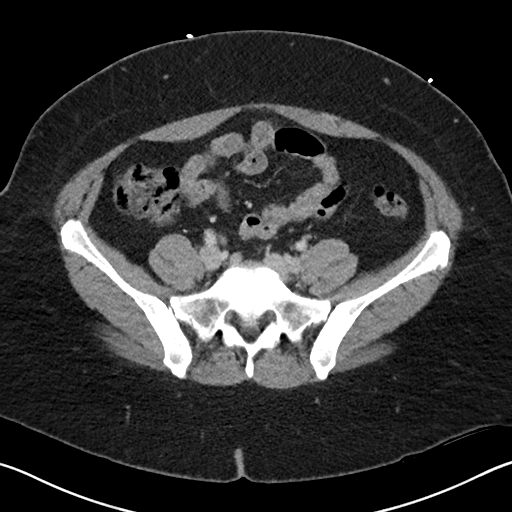
[im 38/90  soft-tissue]
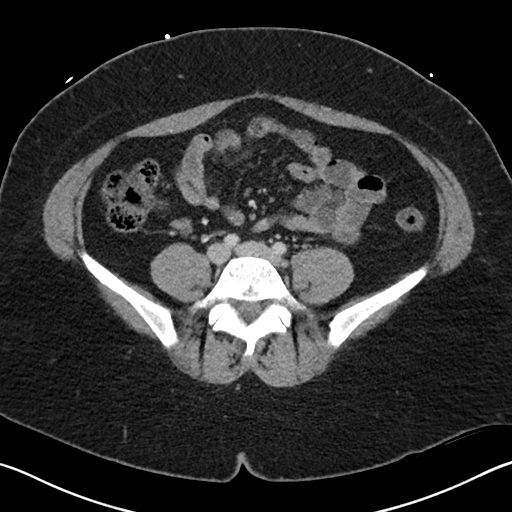
[im 47/90  soft-tissue]
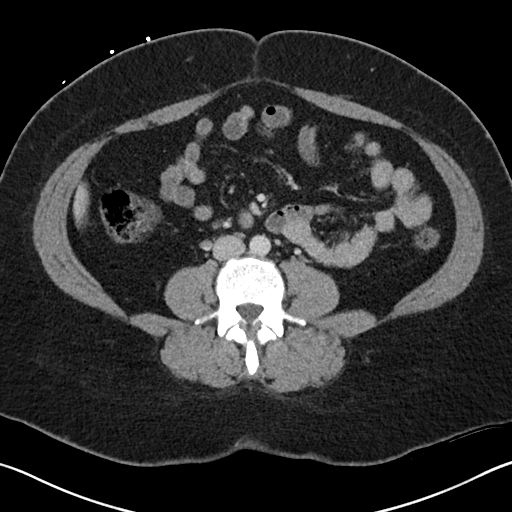
[im 52/90  soft-tissue]
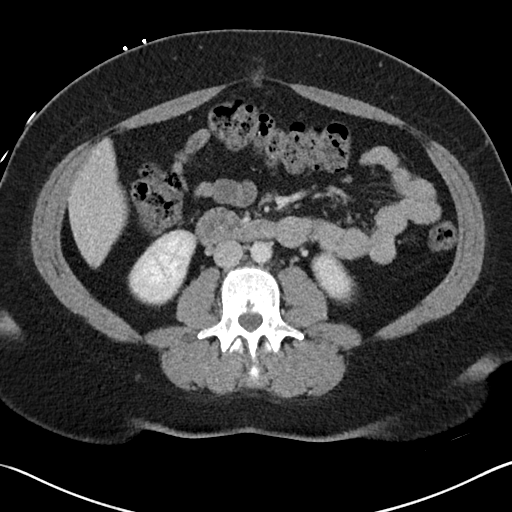
[im 57/90  soft-tissue]
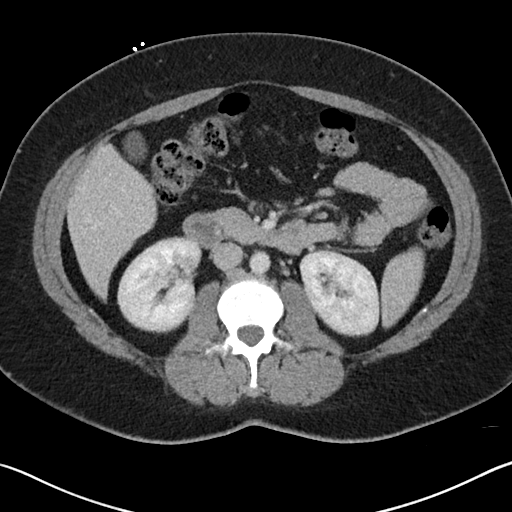
[im 57/90  bone]
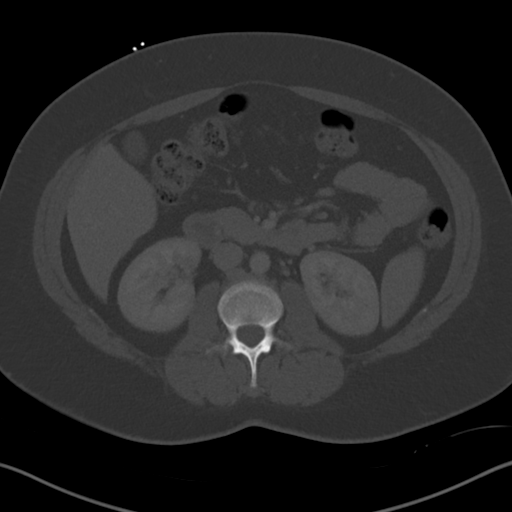
[im 66/90  soft-tissue]
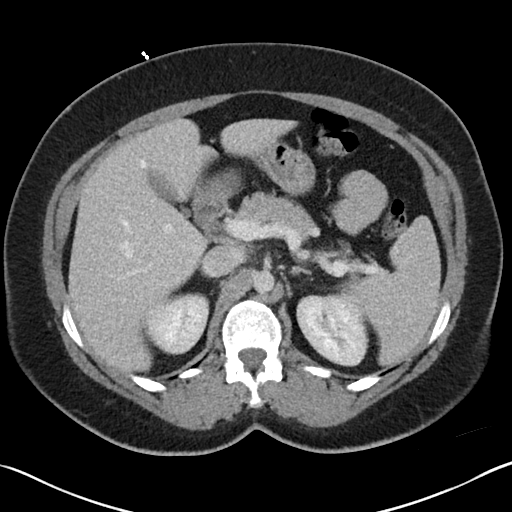
[im 71/90  soft-tissue]
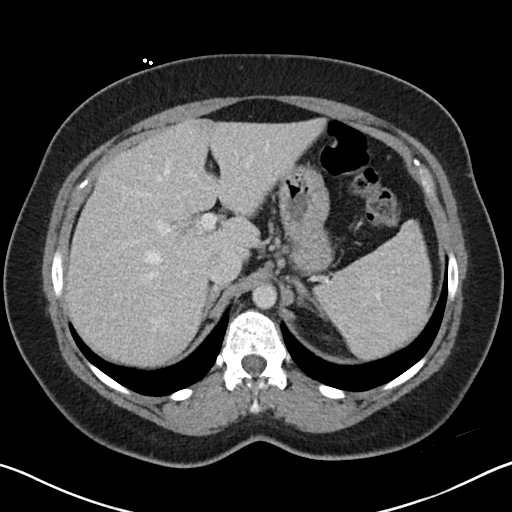
[im 75/90  soft-tissue]
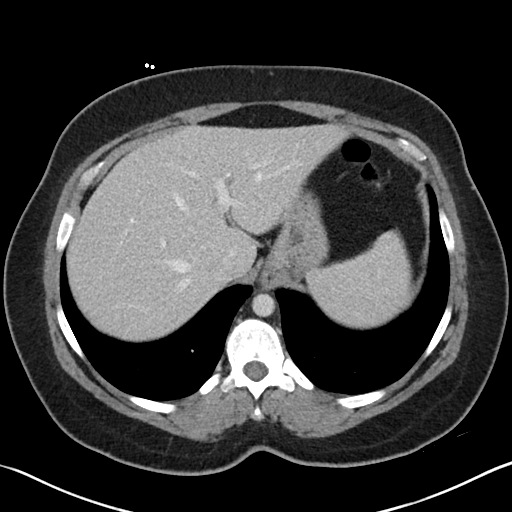
[im 85/90  soft-tissue]
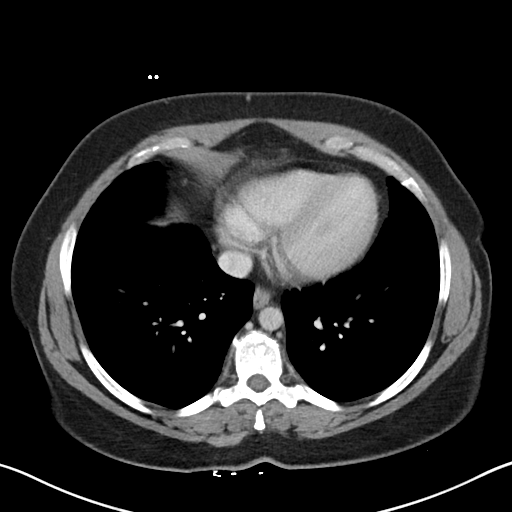

[Series 6: coronal soft tissue · coronal · 0.68mm/px · 3 of 101 slices shown]
[im 34/101  soft-tissue]
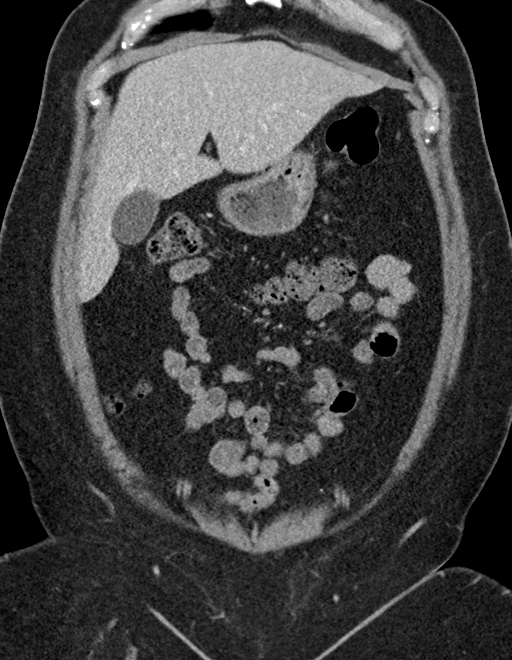
[im 45/101  soft-tissue]
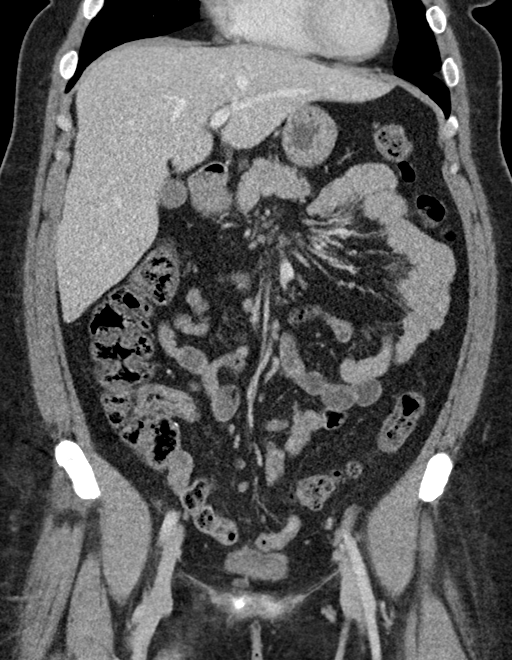
[im 56/101  soft-tissue]
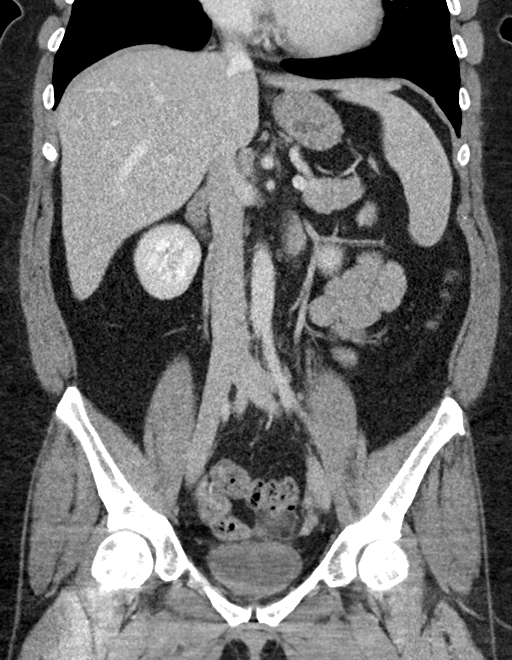

[16 of 46 positions shown; findings below may reference images not displayed]

FINDINGS: Lower chest: No acute abnormality.

Hepatobiliary: No focal liver abnormality. The gallbladder appears
normal. No biliary dilatation.

Pancreas: Unremarkable. No pancreatic ductal dilatation or
surrounding inflammatory changes.

Spleen: Normal in size without focal abnormality.

Adrenals/Urinary Tract: The adrenal glands appear normal. Normal
appearance of both kidneys. No mass or hydronephrosis. The urinary
bladder is unremarkable.

Stomach/Bowel: The stomach is normal. The small bowel loops have a
normal course and caliber. No abnormal small bowel wall thickening
or inflammation. Prior appendectomy. No pathologic dilatation of the
colon.

Vascular/Lymphatic: Normal appearance of the abdominal aorta. No
abdominopelvic adenopathy identified.

Reproductive: Uterus and adnexal structures are unremarkable.

Other: A small supraumbilical ventral abdominal wall hernia contains
fat only, image 53 of series 7. There is no ascites or focal fluid
collections within the abdomen or pelvis.

Musculoskeletal: No acute or significant osseous findings.
IMPRESSION: 1. No acute findings within the abdomen or pelvis. No explanation
for blood in stool.

## 2019-04-12 ENCOUNTER — Telehealth (INDEPENDENT_AMBULATORY_CARE_PROVIDER_SITE_OTHER): Payer: Self-pay | Admitting: Family Medicine

## 2019-04-12 ENCOUNTER — Telehealth: Payer: Self-pay | Admitting: *Deleted

## 2019-04-12 ENCOUNTER — Other Ambulatory Visit: Payer: Self-pay

## 2019-04-12 DIAGNOSIS — R05 Cough: Secondary | ICD-10-CM

## 2019-04-12 DIAGNOSIS — Z20822 Contact with and (suspected) exposure to covid-19: Secondary | ICD-10-CM

## 2019-04-12 DIAGNOSIS — R059 Cough, unspecified: Secondary | ICD-10-CM | POA: Insufficient documentation

## 2019-04-12 NOTE — Assessment & Plan Note (Addendum)
Concern for COVID exposure.  Previous medical history significant for asthma.  She is noticing cough, congestion and chest tightness.  Message was sent to the community testing pool.  She was informed that she should receive a phone call regarding where and when she will be tested.  She is encouraged to be seen in the ED if she experiences significant shortness of breath or any trouble breathing.  She is informed that she will receive a work note through the mail.

## 2019-04-12 NOTE — Telephone Encounter (Signed)
Pt scheduled for covid testing 04/13/19 @ 9:15 @ [facility]. Instructions given and order placed

## 2019-04-12 NOTE — Progress Notes (Signed)
[place] Telemedicine Visit  Patient consented to have virtual visit. Method of visit: Telephone  Encounter participants: Patient: [name] - located at home Provider: Matilde Haymaker - located at [facility] Others (if applicable): none  Chief Complaint: COVID concern  HPI: COVID concern She reports that she was recently out of town for a softball competition for her daughter.  While on the trip, she had frequent social interaction with another mother who later tested positive (7/2).  She reports that her interaction included close social contact without masks.  Mother was asymptomatic at the time of their interaction.  Since returning home from a trip, [name] has noticed that she has had some nasal congestion, sneezing, temperature to 100.3 and a dry cough.  She reports that she is not currently experiencing any shortness of breath although she does note moderate chest tightness similar to previous asthma issues.  She reports that she is taking albuterol with some symptom relief.  She was encouraged to follow-up with a physician when her worker found out that she had exposure to take over the past patient   ROS: per HPI  Pertinent PMHx: History of asthma  Exam:  Respiratory: Coughing noted intermittently throughout the conversation.  No difficulty completing long sentences.  No noticeable shortness of breath.  Assessment/Plan:  Cough Concern for COVID exposure.  Previous medical history significant for asthma.  She is noticing cough, congestion and chest tightness.  Message was sent to the community testing pool.  She was informed that she should receive a phone call regarding where and when she will be tested.  She is encouraged to be seen in the ED if she experiences significant shortness of breath or any trouble breathing.  She is informed that she will receive a work note through [employer].    Time spent during visit with patient: 15  minutes   COVID Drive-Up Test Referral Criteria  Patient age: 43 y.o.  Symptoms: Cough, congestion, temperature to 100.3.   Underlying Conditions: Asthma  Is the patient a first responder? No  Does the patient live or work in a high risk or high density environment: No  Is the patient a COVID convalescent patient who is 14-28 days symptom-free and interested in donating plasma for use as a therapeutic product? No

## 2019-04-12 NOTE — Telephone Encounter (Signed)
-----   Message from Matilde Haymaker, MD sent at 04/12/2019 11:12 AM EDT ----- Regarding: COVID test This patient is experiencing some chest tightness and recent coughing.  She has had recent exposure to a CCOVID positive patient.  Her work is requesting that she be tested and I believe it would be prudent.  Matilde Haymaker, MD.

## 2019-04-13 ENCOUNTER — Other Ambulatory Visit: Payer: Self-pay

## 2019-04-13 DIAGNOSIS — Z20822 Contact with and (suspected) exposure to covid-19: Secondary | ICD-10-CM

## 2019-04-18 LAB — NOVEL CORONAVIRUS, NAA: SARS-CoV-2, NAA: NOT DETECTED

## 2019-04-19 ENCOUNTER — Telehealth: Payer: Self-pay | Admitting: *Deleted

## 2019-04-19 ENCOUNTER — Encounter: Payer: Self-pay | Admitting: Family Medicine

## 2019-04-19 NOTE — Telephone Encounter (Signed)
I called to check on the patient. She has been symptoms free since 04/15/2019 including no fever. She stated that her cough and sniffles were similar to her allergy presentation. However, it has since resolved.  Letter to return to work on file. She will come in to have staff print for her.

## 2019-04-19 NOTE — Telephone Encounter (Signed)
Letter printed and placed at front for pick up.  Christen Bame, CMA

## 2019-04-19 NOTE — Telephone Encounter (Signed)
Pt calls for covid results.  Advised that they were negative.  Pt will need a letter, will forward to MD. Christen Bame, CMA

## 2019-04-29 ENCOUNTER — Encounter (INDEPENDENT_AMBULATORY_CARE_PROVIDER_SITE_OTHER): Payer: Self-pay

## 2019-04-29 ENCOUNTER — Ambulatory Visit: Payer: Self-pay

## 2019-04-29 ENCOUNTER — Telehealth: Payer: Self-pay | Admitting: Physician Assistant

## 2019-04-29 ENCOUNTER — Telehealth: Payer: Self-pay | Admitting: *Deleted

## 2019-04-29 DIAGNOSIS — Z20822 Contact with and (suspected) exposure to covid-19: Secondary | ICD-10-CM

## 2019-04-29 DIAGNOSIS — Z20828 Contact with and (suspected) exposure to other viral communicable diseases: Secondary | ICD-10-CM

## 2019-04-29 NOTE — Telephone Encounter (Signed)
  Pt returning call from My Chart message.  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 Pt has a cough but states that she always has cough because of asthma. She feels slightly SOB today. She has had a tele visit today and the doctor has just emailed her to go tomorrow to testing site for COVID-19 test. Times and locations reviewed with patient.  She verbalized understanding of all instructions. Reason for Disposition . COVID-19 Testing, questions about  Answer Assessment - Initial Assessment Questions 1. CLOSE CONTACT: "Who is the person with the confirmed or suspected COVID-19 infection that you were exposed to?"     Co worker 2. PLACE of CONTACT: "Where were you when you were exposed to COVID-19?" (e.g., home, school, medical waiting room; which city?)     work 3. TYPE of CONTACT: "How much contact was there?" (e.g., sitting next to, live in same house, work in same office, same building)    Close in same office 4. DURATION of CONTACT: "How long were you in contact with the COVID-19 patient?" (e.g., a few seconds, passed by person, a few minutes, live with the patient)    daily 5. DATE of CONTACT: "When did you have contact with a COVID-19 patient?" (e.g., how many days ago)    Monday 6. TRAVEL: "Have you traveled out of the country recently?" If so, "When and where?"     * Also ask about out-of-state travel, since the CDC has identified some high-risk cities for community spread in the [place].     * Note: Travel becomes less relevant if there is widespread community transmission where the patient lives.    no 7. COMMUNITY SPREAD: "Are there lots of cases of COVID-19 (community spread) where you live?" (See public health department website, if unsure)      yes 8. SYMPTOMS: "Do you have any symptoms?" (e.g., fever, cough, breathing difficulty)    SOB asthma 9. PREGNANCY OR POSTPARTUM: "Is there any chance you are pregnant?" "When was your last menstrual period?"  "Did you deliver in the last 2 weeks?"    no 10. HIGH RISK: "Do you have any heart or lung problems? Do you have a weak immune system?" (e.g., CHF, COPD, asthma, HIV positive, chemotherapy, renal failure, diabetes mellitus, sickle cell anemia)       asthma  Protocols used: CORONAVIRUS (COVID-19) EXPOSURE-A-AH

## 2019-04-29 NOTE — Telephone Encounter (Signed)
BPA received for new onset SOB via [place] Monitoring Questionnaire. Pt also noted to have a evist today as well. Pt called but no answer at this time.Left message for pt to return call to discuss symptoms and to notify PCP for any worsening symptoms. MyChart message also sent to the pt advising pt to seek treatment in the ED if symptoms become severe or if SOB becomes worse.

## 2019-04-29 NOTE — Progress Notes (Signed)
E-Visit for Corona Virus Screening   Your current symptoms could be consistent with the coronavirus.  Many health care providers can now test patients at their office but not all are.  [facility] has multiple testing sites. For information on our COVID testing locations and hours go to [email or web address]  Please quarantine yourself while awaiting your test results.  We are enrolling you in our [place] for [place] . Daily you will receive a questionnaire within the [place] website. Our COVID 19 response team willl be monitoriing your responses daily.   COVID-19 is a respiratory illness with symptoms that are similar to the flu. Symptoms are typically mild to moderate, but there have been cases of severe illness and death due to the virus. The following symptoms may appear 2-14 days after exposure: . Fever . Cough . Shortness of breath or difficulty breathing . Chills . Repeated shaking with chills . Muscle pain . Headache . Sore throat . New loss of taste or smell . Fatigue . Congestion or runny nose . Nausea or vomiting . Diarrhea  It is vitally important that if you feel that you have an infection such as this virus or any other virus that you stay home and away from places where you may spread it to others.  You should self-quarantine for 14 days if you have symptoms that could potentially be coronavirus or have been in close contact a with a person diagnosed with COVID-19 within the last 2 weeks. You should avoid contact with people age 43 and older.   You should wear a mask or cloth face covering over your nose and mouth if you must be around other people or animals, including pets (even at home). Try to stay at least 6 feet away from other people. This will protect the people around you.  You may also take acetaminophen (Tylenol) as needed for fever.   Reduce your risk of any infection by using the same precautions used for avoiding the  common cold or flu:  Marland Kitchen Wash your hands often with soap and warm water for at least 20 seconds.  If soap and water are not readily available, use an alcohol-based hand sanitizer with at least 60% alcohol.  . If coughing or sneezing, cover your mouth and nose by coughing or sneezing into the elbow areas of your shirt or coat, into a tissue or into your sleeve (not your hands). . Avoid shaking hands with others and consider head nods or verbal greetings only. . Avoid touching your eyes, nose, or mouth with unwashed hands.  . Avoid close contact with people who are sick. . Avoid places or events with large numbers of people in one location, like concerts or sporting events. . Carefully consider travel plans you have or are making. . If you are planning any travel outside or inside the [place], visit the CDC's Travelers' Health webpage for the latest health notices. . If you have some symptoms but not all symptoms, continue to monitor at home and seek medical attention if your symptoms worsen. . If you are having a medical emergency, call 911.  HOME CARE . Only take medications as instructed by your medical team. . Drink plenty of fluids and get plenty of rest. . A steam or ultrasonic humidifier can help if you have congestion.   GET HELP RIGHT AWAY IF YOU HAVE EMERGENCY WARNING SIGNS** FOR COVID-19. If you or someone is showing any of these signs seek emergency medical care immediately. Call 911  or proceed to your closest emergency facility if: . You develop worsening high fever. . Trouble breathing . Bluish lips or face . Persistent pain or pressure in the chest . New confusion . Inability to wake or stay awake . You cough up blood. . Your symptoms become more severe  **This list is not all possible symptoms. Contact your medical provider for any symptoms that are sever or concerning to you.   MAKE SURE YOU   Understand these instructions.  Will watch your condition.  Will get help right  away if you are not doing well or get worse.  Your e-visit answers were reviewed by a board certified advanced clinical practitioner to complete your personal care plan.  Depending on the condition, your plan could have included both over the counter or prescription medications.  If there is a problem please reply once you have received a response from your provider.  Your safety is important to [place].  If you have drug allergies check your prescription carefully.    You can use MyChart to ask questions about today's visit, request a non-urgent call back, or ask for a work or school excuse for 24 hours related to this e-Visit. If it has been greater than 24 hours you will need to follow up with your provider, or enter a new e-Visit to address those concerns. You will get an e-mail in the next two days asking about your experience.  I hope that your e-visit has been valuable and will speed your recovery. Thank you for using e-visits.

## 2019-04-29 NOTE — Progress Notes (Signed)
I have spent 5 minutes in review of e-visit questionnaire, review and updating patient chart, medical decision making and response to patient.   Stetson Pelaez Cody Karynn Deblasi, PA-C    

## 2019-04-29 NOTE — Addendum Note (Signed)
Addended by: Brunetta Jeans on: 04/29/2019 04:32 PM   Modules accepted: Orders

## 2019-04-30 ENCOUNTER — Encounter (INDEPENDENT_AMBULATORY_CARE_PROVIDER_SITE_OTHER): Payer: Self-pay

## 2019-04-30 ENCOUNTER — Other Ambulatory Visit: Payer: Self-pay

## 2019-04-30 DIAGNOSIS — Z20822 Contact with and (suspected) exposure to covid-19: Secondary | ICD-10-CM

## 2019-05-02 ENCOUNTER — Encounter (INDEPENDENT_AMBULATORY_CARE_PROVIDER_SITE_OTHER): Payer: Self-pay

## 2019-05-03 LAB — NOVEL CORONAVIRUS, NAA: SARS-CoV-2, NAA: NOT DETECTED

## 2019-05-05 ENCOUNTER — Encounter (INDEPENDENT_AMBULATORY_CARE_PROVIDER_SITE_OTHER): Payer: Self-pay

## 2019-05-05 ENCOUNTER — Telehealth: Payer: Self-pay | Admitting: *Deleted

## 2019-05-05 ENCOUNTER — Encounter: Payer: Self-pay | Admitting: Family Medicine

## 2019-05-05 NOTE — Telephone Encounter (Signed)
Pt had to be tested yet again because she was exposed at work.  She has been asymptomatic the entire time.   She needs another letter stating that she was negative and can return to work.  Christen Bame, CMA

## 2019-05-05 NOTE — Telephone Encounter (Signed)
Please document the following:  1. Exposure date 2. Specify no fever and last temp check.  Thanks.

## 2019-05-05 NOTE — Telephone Encounter (Signed)
Pt is asymptomatic.  Christen Bame, CMA

## 2019-05-05 NOTE — Telephone Encounter (Signed)
Letter created on file.

## 2019-05-06 NOTE — Telephone Encounter (Signed)
Patient contacted and informed of letter ready for pick up, with a copy of negative COVID test, per patient request.

## 2019-11-20 DIAGNOSIS — Z03818 Encounter for observation for suspected exposure to other biological agents ruled out: Secondary | ICD-10-CM | POA: Diagnosis not present

## 2019-11-20 DIAGNOSIS — Z20828 Contact with and (suspected) exposure to other viral communicable diseases: Secondary | ICD-10-CM | POA: Diagnosis not present

## 2019-12-28 DIAGNOSIS — Z1152 Encounter for screening for COVID-19: Secondary | ICD-10-CM | POA: Diagnosis not present

## 2019-12-28 DIAGNOSIS — R509 Fever, unspecified: Secondary | ICD-10-CM | POA: Diagnosis not present

## 2019-12-28 DIAGNOSIS — Z20828 Contact with and (suspected) exposure to other viral communicable diseases: Secondary | ICD-10-CM | POA: Diagnosis not present

## 2019-12-28 DIAGNOSIS — Z03818 Encounter for observation for suspected exposure to other biological agents ruled out: Secondary | ICD-10-CM | POA: Diagnosis not present

## 2019-12-28 DIAGNOSIS — R05 Cough: Secondary | ICD-10-CM | POA: Diagnosis not present

## 2020-02-21 DIAGNOSIS — Z03818 Encounter for observation for suspected exposure to other biological agents ruled out: Secondary | ICD-10-CM | POA: Diagnosis not present

## 2020-02-21 DIAGNOSIS — Z20828 Contact with and (suspected) exposure to other viral communicable diseases: Secondary | ICD-10-CM | POA: Diagnosis not present

## 2020-04-24 ENCOUNTER — Encounter: Payer: Self-pay | Admitting: Family Medicine

## 2020-04-24 ENCOUNTER — Other Ambulatory Visit: Payer: Self-pay

## 2020-04-24 ENCOUNTER — Ambulatory Visit (INDEPENDENT_AMBULATORY_CARE_PROVIDER_SITE_OTHER): Payer: BC Managed Care – PPO | Admitting: Family Medicine

## 2020-04-24 VITALS — BP 128/74 | HR 88 | Ht 63.0 in | Wt 248.0 lb

## 2020-04-24 DIAGNOSIS — Z1159 Encounter for screening for other viral diseases: Secondary | ICD-10-CM

## 2020-04-24 DIAGNOSIS — Z1239 Encounter for other screening for malignant neoplasm of breast: Secondary | ICD-10-CM

## 2020-04-24 DIAGNOSIS — Z0000 Encounter for general adult medical examination without abnormal findings: Secondary | ICD-10-CM

## 2020-04-24 DIAGNOSIS — J329 Chronic sinusitis, unspecified: Secondary | ICD-10-CM | POA: Diagnosis not present

## 2020-04-24 DIAGNOSIS — K219 Gastro-esophageal reflux disease without esophagitis: Secondary | ICD-10-CM

## 2020-04-24 DIAGNOSIS — Z9889 Other specified postprocedural states: Secondary | ICD-10-CM

## 2020-04-24 DIAGNOSIS — E785 Hyperlipidemia, unspecified: Secondary | ICD-10-CM | POA: Diagnosis not present

## 2020-04-24 DIAGNOSIS — Z96652 Presence of left artificial knee joint: Secondary | ICD-10-CM

## 2020-04-24 DIAGNOSIS — Z114 Encounter for screening for human immunodeficiency virus [HIV]: Secondary | ICD-10-CM

## 2020-04-24 DIAGNOSIS — T63444S Toxic effect of venom of bees, undetermined, sequela: Secondary | ICD-10-CM

## 2020-04-24 DIAGNOSIS — G4700 Insomnia, unspecified: Secondary | ICD-10-CM

## 2020-04-24 DIAGNOSIS — T782XXS Anaphylactic shock, unspecified, sequela: Secondary | ICD-10-CM

## 2020-04-24 HISTORY — DX: Encounter for other screening for malignant neoplasm of breast: Z12.39

## 2020-04-24 LAB — POCT GLYCOSYLATED HEMOGLOBIN (HGB A1C): Hemoglobin A1C: 5 % (ref 4.0–5.6)

## 2020-04-24 MED ORDER — EPINEPHRINE 0.3 MG/0.3ML IJ SOAJ: 0.3000 mg | Freq: Once | INTRAMUSCULAR | 1 refills | Status: DC | PRN

## 2020-04-24 MED ORDER — TRAZODONE HCL 50 MG PO TABS: 50.0000 mg | ORAL_TABLET | Freq: Every evening | ORAL | 3 refills | Status: DC | PRN

## 2020-04-24 MED ORDER — OMEPRAZOLE 40 MG PO CPDR: DELAYED_RELEASE_CAPSULE | ORAL | 3 refills | Status: DC

## 2020-04-24 MED ORDER — FLUTICASONE PROPIONATE 50 MCG/ACT NA SUSP: 2.0000 | Freq: Every day | NASAL | 6 refills | Status: AC

## 2020-04-24 NOTE — Assessment & Plan Note (Signed)
Generally healthy with no at risk behaviors. Strongly encouraged Covid vaccination. She agrees to return soon for PAP

## 2020-04-24 NOTE — Progress Notes (Signed)
    SUBJECTIVE:   CHIEF COMPLAINT / HPI:   Annual exam Acute problems. Needs multiple med refills. Chronic problems: 1. Insomnia.  Severe.  She has tried sleep hygeine, melatonin and is currently taking multiple diphenhydramine each night. Limits caffeine.  2. Anxiety state.  Tends to be anxious all her life.  Not worse recently.  Does not feel that untreated anxiety is the cause of her difficulty sleeping. 3. Obesity.  Has gained wt with COVID.  Difficult to exercise with knee arthritis.  Do I have any solutions?  Eats a healthy diet 4. Chronic recurrent sinusitis.  Gets antibiotics several times per year.  Has not been using flonase regularly. HPDP Not yet taken COVID vaccine.  She is reluctant.  Did not come prepared for PAP.  She was surprised that last pap was 2013.  Willing to have bloodwork done. Lifestyle: Generally healthy.  Does not smoke or drink.  No at risk behaviors.  Not exercising as above.  PERTINENT  PMH / PSH: Denies CP, DOE, abd pain, bleeding, weakness or numbness.  Denies change in bowel, bladder, appetite or weight.  FHx positive for thyroid disease.    OBJECTIVE:   BP 128/74   Pulse 88   Ht 5\' 3"  (1.6 m)   Wt 248 lb (112.5 kg)   LMP 04/05/2020 (Approximate)   SpO2 98%   BMI 43.93 kg/m   HEENT normal Neck supple Lungs clear Cardiac RRR without m or g Abd benign Ext trace bilateral edema Neuro grossly normal strength, sensation, gait, affect and cognition.  ASSESSMENT/PLAN:   Morbid obesity ([place]) Discussed bariatric surgery and water aerobics.  Insomnia Already tried melatonin and sleep hygeine.  Trial of trazodone.    Preventative health care Generally healthy with no at risk behaviors. Strongly encouraged Covid vaccination. She agrees to return soon for PAP  S/P total knee replacement, left Suggested water aerobics for exercise.  Encounter for screening for human immunodeficiency virus (HIV) Low risk, screen x 1  Encounter for hepatitis  C screening test for low risk patient Low risk, screen x 1  Breast cancer screening Given FHx positive, recommend mammography now.  Chronic GERD Wants refill on PPI.  Chronic sinusitis Educated about limiting antibiotics to 2-3 times per year.  Dyslipidemia Lipid panel  Bee sting-induced anaphylaxis Refill epi pen     Amber Resides, MD [place]

## 2020-04-24 NOTE — Assessment & Plan Note (Signed)
Refill epi pen  

## 2020-04-24 NOTE — Assessment & Plan Note (Signed)
Already tried melatonin and sleep hygeine.  Trial of trazodone.

## 2020-04-24 NOTE — Assessment & Plan Note (Signed)
Low risk, screen x 1

## 2020-04-24 NOTE — Assessment & Plan Note (Signed)
Educated about limiting antibiotics to 2-3 times per year.

## 2020-04-24 NOTE — Assessment & Plan Note (Signed)
Suggested water aerobics for exercise.

## 2020-04-24 NOTE — Assessment & Plan Note (Signed)
Given FHx positive, recommend mammography now.

## 2020-04-24 NOTE — Assessment & Plan Note (Deleted)
Actually a "partial" knee replacement.

## 2020-04-24 NOTE — Patient Instructions (Addendum)
I have refilled medicines. Please consider bariatric surgery.  Consider water aerobics. Please come back for a pap smear - you are overdue. I strongly recommend the COVID vaccine - Phizer or Levan Hurst would be my choice.  I sent in a prescription for a sleep medication - trazodone.   Someone should call about the mammogram I will call with blood test results.

## 2020-04-24 NOTE — Assessment & Plan Note (Addendum)
Lipid panel Based on normal lipid panel, will remove this problem from her list.

## 2020-04-24 NOTE — Assessment & Plan Note (Signed)
Discussed bariatric surgery and water aerobics.

## 2020-04-24 NOTE — Assessment & Plan Note (Signed)
Wants refill on PPI.

## 2020-04-25 LAB — CBC
Hematocrit: 43.1 % (ref 34.0–46.6)
Hemoglobin: 14.1 g/dL (ref 11.1–15.9)
MCH: 28.1 pg (ref 26.6–33.0)
MCHC: 32.7 g/dL (ref 31.5–35.7)
MCV: 86 fL (ref 79–97)
Platelets: 262 10*3/uL (ref 150–450)
RBC: 5.01 x10E6/uL (ref 3.77–5.28)
RDW: 13.1 % (ref 11.7–15.4)
WBC: 8.2 10*3/uL (ref 3.4–10.8)

## 2020-04-25 LAB — LIPID PANEL
Chol/HDL Ratio: 2.6 ratio (ref 0.0–4.4)
Cholesterol, Total: 142 mg/dL (ref 100–199)
HDL: 54 mg/dL (ref >39)
LDL Chol Calc (NIH): 74 mg/dL (ref 0–99)
Triglycerides: 72 mg/dL (ref 0–149)
VLDL Cholesterol Cal: 14 mg/dL (ref 5–40)

## 2020-04-25 LAB — HIV ANTIBODY (ROUTINE TESTING W REFLEX): HIV Screen 4th Generation wRfx: NONREACTIVE

## 2020-04-25 LAB — TSH: TSH: 0.916 u[IU]/mL (ref 0.450–4.500)

## 2020-04-25 LAB — HEPATITIS C ANTIBODY: Hep C Virus Ab: <0.1 s/co ratio (ref 0.0–0.9)

## 2020-04-25 NOTE — Progress Notes (Signed)
Called and informed of normal labs.

## 2020-05-03 ENCOUNTER — Other Ambulatory Visit: Payer: Self-pay

## 2020-05-03 ENCOUNTER — Ambulatory Visit
Admission: RE | Admit: 2020-05-03 | Discharge: 2020-05-03 | Disposition: A | Discharge status: Home / Self Care | Payer: BC Managed Care – PPO | Source: Ambulatory Visit | Attending: Family Medicine | Admitting: Family Medicine

## 2020-05-03 DIAGNOSIS — Z1239 Encounter for other screening for malignant neoplasm of breast: Secondary | ICD-10-CM

## 2020-05-03 DIAGNOSIS — Z1231 Encounter for screening mammogram for malignant neoplasm of breast: Secondary | ICD-10-CM | POA: Diagnosis not present

## 2020-05-16 ENCOUNTER — Other Ambulatory Visit: Payer: Self-pay | Admitting: Family Medicine

## 2020-05-16 DIAGNOSIS — G4700 Insomnia, unspecified: Secondary | ICD-10-CM

## 2020-07-21 DIAGNOSIS — Z20822 Contact with and (suspected) exposure to covid-19: Secondary | ICD-10-CM | POA: Diagnosis not present

## 2020-07-21 DIAGNOSIS — Z03818 Encounter for observation for suspected exposure to other biological agents ruled out: Secondary | ICD-10-CM | POA: Diagnosis not present

## 2020-07-21 DIAGNOSIS — H66001 Acute suppurative otitis media without spontaneous rupture of ear drum, right ear: Secondary | ICD-10-CM | POA: Diagnosis not present

## 2020-09-21 DIAGNOSIS — Z20822 Contact with and (suspected) exposure to covid-19: Secondary | ICD-10-CM | POA: Diagnosis not present

## 2020-09-26 DIAGNOSIS — Z03818 Encounter for observation for suspected exposure to other biological agents ruled out: Secondary | ICD-10-CM | POA: Diagnosis not present

## 2020-10-16 DIAGNOSIS — U071 COVID-19: Secondary | ICD-10-CM | POA: Diagnosis not present

## 2020-10-16 DIAGNOSIS — R509 Fever, unspecified: Secondary | ICD-10-CM | POA: Diagnosis not present

## 2020-10-19 DIAGNOSIS — U071 COVID-19: Secondary | ICD-10-CM | POA: Diagnosis not present

## 2020-10-19 DIAGNOSIS — R509 Fever, unspecified: Secondary | ICD-10-CM | POA: Diagnosis not present

## 2021-06-04 ENCOUNTER — Other Ambulatory Visit: Payer: Self-pay | Admitting: Family Medicine

## 2021-06-04 DIAGNOSIS — Z1231 Encounter for screening mammogram for malignant neoplasm of breast: Secondary | ICD-10-CM

## 2021-06-26 ENCOUNTER — Other Ambulatory Visit: Payer: Self-pay

## 2021-06-26 ENCOUNTER — Ambulatory Visit
Admission: RE | Admit: 2021-06-26 | Discharge: 2021-06-26 | Disposition: A | Discharge status: Home / Self Care | Payer: BC Managed Care – PPO | Source: Ambulatory Visit | Attending: Family Medicine | Admitting: Family Medicine

## 2021-06-26 DIAGNOSIS — Z1231 Encounter for screening mammogram for malignant neoplasm of breast: Secondary | ICD-10-CM

## 2021-09-01 ENCOUNTER — Other Ambulatory Visit: Payer: Self-pay

## 2021-09-01 ENCOUNTER — Ambulatory Visit (HOSPITAL_COMMUNITY)
Admission: EM | Admit: 2021-09-01 | Discharge: 2021-09-01 | Disposition: A | Discharge status: Home / Self Care | Payer: BC Managed Care – PPO | Attending: Emergency Medicine | Admitting: Emergency Medicine

## 2021-09-01 ENCOUNTER — Encounter (HOSPITAL_COMMUNITY): Payer: Self-pay

## 2021-09-01 DIAGNOSIS — J069 Acute upper respiratory infection, unspecified: Secondary | ICD-10-CM | POA: Diagnosis not present

## 2021-09-01 DIAGNOSIS — J4541 Moderate persistent asthma with (acute) exacerbation: Secondary | ICD-10-CM

## 2021-09-01 DIAGNOSIS — J9801 Acute bronchospasm: Secondary | ICD-10-CM | POA: Diagnosis not present

## 2021-09-01 DIAGNOSIS — R062 Wheezing: Secondary | ICD-10-CM

## 2021-09-01 DIAGNOSIS — H66001 Acute suppurative otitis media without spontaneous rupture of ear drum, right ear: Secondary | ICD-10-CM

## 2021-09-01 MED ORDER — BUDESONIDE 1 MG/2ML IN SUSP
1.0000 mg | Freq: Every day | RESPIRATORY_TRACT | 0 refills | Status: DC
Start: 2021-09-01 — End: 2023-03-20

## 2021-09-01 MED ORDER — METHYLPREDNISOLONE 4 MG PO TABS: ORAL_TABLET | ORAL | 0 refills | Status: AC

## 2021-09-01 MED ORDER — CEFDINIR 300 MG PO CAPS: 300.0000 mg | ORAL_CAPSULE | Freq: Two times a day (BID) | ORAL | 0 refills | Status: AC

## 2021-09-01 MED ORDER — METHYLPREDNISOLONE SODIUM SUCC 125 MG IJ SOLR
125.0000 mg | Freq: Once | INTRAMUSCULAR | Status: AC
  Administered 2021-09-01: 125 mg via INTRAMUSCULAR

## 2021-09-01 MED ORDER — ALBUTEROL SULFATE 1.25 MG/3ML IN NEBU: 1.0000 | INHALATION_SOLUTION | Freq: Four times a day (QID) | RESPIRATORY_TRACT | 0 refills | Status: DC | PRN

## 2021-09-01 MED ORDER — METHYLPREDNISOLONE SODIUM SUCC 125 MG IJ SOLR
INTRAMUSCULAR | Status: AC
  Filled 2021-09-01: qty 2

## 2021-09-01 NOTE — ED Triage Notes (Signed)
Pt presents with sinus pressure, bilateral ear pain, chest congestion, non productive cough, and wheezing X 1 week.
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
## 2021-09-01 NOTE — Discharge Instructions (Addendum)
He received an injection of Solu-Medrol in the office today.  Tomorrow morning please begin taking methylprednisolone exactly as prescribed.  I provided you with an 8-day course but tapers down t each day.  This combined steroid treatment should significantly reduce your bronchospasm and upper respiratory wheezing.    I was advised by our nurse that we are giving away nebulizer machines so I have provided you with prescriptions for Pulmicort which is a nebulized steroid and albuterol nebulizer solution, the Pulmicort can be used once daily and the albuterol can be used up to 4 times daily.  You would use these in place of your albuterol HFA inhaler and Flovent inhaler temporarily, some people find that the nebulizer treatments tend to work little bit better than the handheld inhalers when they are not feeling otherwise well.  When you are feeling better, you can certainly go back to using your HFA and Flovent inhalers again.  Please begin cefdinir for acute otitis media in your right ear, please take 1 capsule twice daily for the next 10 days.  Patient also significantly reduce your symptoms of sinusitis and bronchitis.  Please be sure to follow-up with your primary care physician the next 3 to 5 days if you do not have significant relief of your symptoms.  If you are unable to obtain appointment, please feel free to return to urgent care for repeat evaluation.  Thank you for visiting urgent care today, it was a pleasure to meet you and your daughter and I hope you both feel better soon.

## 2021-10-11 ENCOUNTER — Other Ambulatory Visit: Payer: Self-pay

## 2021-10-11 ENCOUNTER — Telehealth: Payer: Self-pay

## 2021-10-11 ENCOUNTER — Encounter: Payer: Self-pay | Admitting: Student

## 2021-10-11 ENCOUNTER — Ambulatory Visit: Payer: BC Managed Care – PPO | Admitting: Student

## 2021-10-11 VITALS — BP 128/74 | HR 97 | Ht 63.0 in | Wt 259.2 lb

## 2021-10-11 DIAGNOSIS — M65331 Trigger finger, right middle finger: Secondary | ICD-10-CM

## 2021-10-11 DIAGNOSIS — H9203 Otalgia, bilateral: Secondary | ICD-10-CM | POA: Diagnosis not present

## 2021-10-11 DIAGNOSIS — M79644 Pain in right finger(s): Secondary | ICD-10-CM

## 2021-10-11 HISTORY — DX: Trigger finger, right middle finger: M65.331

## 2021-10-11 HISTORY — DX: Otalgia, bilateral: H92.03

## 2021-10-11 MED ORDER — AMOXICILLIN-POT CLAVULANATE 500-125 MG PO TABS
1.0000 | ORAL_TABLET | Freq: Three times a day (TID) | ORAL | 0 refills | Status: DC
Start: 2021-10-11 — End: 2021-10-12

## 2021-10-11 NOTE — Patient Instructions (Addendum)
It was great to see you today! Thank you for choosing [facility] for your primary care. [name] was seen for right middle finger pain and ear pain with congestion.  Our plans for today were:  -Finger pain: I placed an order for an x-ray and we will follow-up with you based on the results. -Ear pain with congestion: I prescribed you Augmentin to take 3 times a day every 8 hours for 10 days.  I highly advise you take this with food and drink as it may cause an upset stomach.  I also advised taking Flonase for your nasal congestion.  I recommend that you always bring your medications to each appointment as this makes it easy to ensure you are on the correct medications and helps [place] not miss refills when you need them.  Please arrive 15 minutes before your appointment to ensure smooth check in process.  We appreciate your efforts in making this happen.  Take care and seek immediate care sooner if you develop any concerns.   Thank you for allowing me to participate in your care, Wells Guiles, DO 10/11/2021, 4:30 PM PGY-1, [place]

## 2021-10-11 NOTE — Assessment & Plan Note (Signed)
Physical exam consistent with bilateral otitis media likely 2/2 recent viral sickness but perhaps not fully resolved from previous ear infection.  Advised Flonase for nasal congestion and prescribed Augmentin 3 times daily x10 days.  Consider ENT referral given recurrent congestion and ear infections.

## 2021-10-11 NOTE — Progress Notes (Signed)
°  SUBJECTIVE:   CHIEF COMPLAINT / HPI:   R middle finger pain: Started about 1 month, no trauma then, first presented as stiff and getting locked, felt like a trigger thumb which she has had in the past, uncomfortable but not painful. She was using voltaren gel and tiger balm, neither of those helped.  She notes that on Monday her daughter accidentally kicked her finger and she said she felt a pop. It swelled up and she iced it, improved but not back to normal looking. She cannot write due to the pain and discomfort of bending it.   Ear pain and congestion: Since Tuesday afternoon she started feeling sick, covid negative x2 at home. Congestion, cough, tenderness of R ear when sleeping on it that feels like pressure. No sore throat. Yellow sputum production. Feels like there is water moving around in her R ear. Of note, patient was treated for ear infection in late November with Omnicef and methylprednisolone pack.  PERTINENT  PMH / PSH: asthma, insomnia  OBJECTIVE:  BP 128/74    Pulse 97    Ht 5\' 3"  (1.6 m)    Wt 259 lb 3.2 oz (117.6 kg)    SpO2 97%    BMI 45.92 kg/m    General: NAD, pleasant, able to participate in exam HEENT: bulging erythematous left TM, tympanic effusion in R ear, tenderness with ear exam b/l; oropharynx wnl Cardiac: RRR, no murmurs. Respiratory: CTAB, normal effort, No wheezes, rales or rhonchi Extremities: right middle PIP swollen and tender to palpation throughout, capable of 30 degree flexure with pain, no signs nodule on hand  ASSESSMENT/PLAN:  Pain in finger of right hand Originally concerned for for trigger finger but with recent trauma considering jammed finger versus fracture.  X-ray of right middle digit.  Follow-up in 1 week.  Ear pain, bilateral Physical exam consistent with bilateral otitis media likely 2/2 recent viral sickness but perhaps not fully resolved from previous ear infection.  Advised Flonase for nasal congestion and prescribed Augmentin 3 times  daily x10 days.  Consider ENT referral given recurrent congestion and ear infections.  Return in about 1 week (around 10/18/2021) for Finger pain and ear infection follow-up.   Wells Guiles, DO 10/11/2021, 5:55 PM PGY-1, [place]

## 2021-10-11 NOTE — Telephone Encounter (Signed)
Patient calls nurse line reporting she has an apt today for her finger, however started experiencing cold/sinus symptoms since Monday. Patient reports cough, congestion and ear pain. Patient denies fever, body aches or SOB. Patient reports negative home covid test last night.   Patient advised to come in as scheduled and to wear a mask.

## 2021-10-11 NOTE — Assessment & Plan Note (Addendum)
Originally concerned for for trigger finger but with recent trauma considering jammed finger versus fracture.  X-ray of right middle digit.  Follow-up in 1 week.

## 2021-10-12 ENCOUNTER — Encounter: Payer: Self-pay | Admitting: Student

## 2021-10-12 ENCOUNTER — Telehealth: Payer: Self-pay

## 2021-10-12 ENCOUNTER — Ambulatory Visit
Admission: RE | Admit: 2021-10-12 | Discharge: 2021-10-12 | Disposition: A | Discharge status: Home / Self Care | Payer: BC Managed Care – PPO | Source: Ambulatory Visit | Attending: Family Medicine | Admitting: Family Medicine

## 2021-10-12 DIAGNOSIS — S6991XA Unspecified injury of right wrist, hand and finger(s), initial encounter: Secondary | ICD-10-CM | POA: Diagnosis not present

## 2021-10-12 DIAGNOSIS — M79644 Pain in right finger(s): Secondary | ICD-10-CM

## 2021-10-12 MED ORDER — LEVOFLOXACIN 500 MG PO TABS: 500.0000 mg | ORAL_TABLET | Freq: Every day | ORAL | 0 refills | Status: DC

## 2021-10-12 NOTE — Telephone Encounter (Signed)
Patient calls nurse line reporting undesired side effects to recent prescribed antibiotic. Patient reports she has taken (2) doses so far and each time vomited. Patient reports she is taking on a full stomach. Patient is requesting a prescription change as she feels she can no longer tolerate current one.   Patient is also requesting a note for work today. Patient reports she plans to return to full duty on Monday.   Will forward to provider who saw patient.

## 2021-10-12 NOTE — Telephone Encounter (Signed)
Spoke with pt regarding poor tolerance of Augmentin. Discontinue Augmentin and updated allergy/contraindication list. RX sent for levofloxacin 500mg  q24h x 5 days. Work note signed as well. Please print for patient as she states she will come by office to pick up work note.

## 2021-10-23 ENCOUNTER — Telehealth: Payer: Self-pay

## 2021-10-23 DIAGNOSIS — H9203 Otalgia, bilateral: Secondary | ICD-10-CM

## 2021-10-23 NOTE — Telephone Encounter (Signed)
Patient calls nurse line reporting continued bilateral ear pain. Patient reports she finished the antibiotic without much relief. Patient reports her right ear has been "crusted" over in the mornings when she wakes up. However, has not noticed any drainage during the day. Patient reports the colder weather and wind have really "aggravated" the pain.   Patient is requesting to be referred to an ENT.   Will forward to provider who saw patient.

## 2021-10-24 NOTE — Telephone Encounter (Signed)
Referral placed for ENT given recurrent bilateral ear pain after several completed rounds of antibiotics.

## 2021-12-10 NOTE — Progress Notes (Signed)
error 

## 2021-12-27 ENCOUNTER — Other Ambulatory Visit: Payer: Self-pay

## 2021-12-27 ENCOUNTER — Other Ambulatory Visit (HOSPITAL_COMMUNITY)
Admission: RE | Admit: 2021-12-27 | Discharge: 2021-12-27 | Disposition: A | Discharge status: Home / Self Care | Payer: Managed Care, Other (non HMO) | Source: Ambulatory Visit | Attending: Family Medicine | Admitting: Family Medicine

## 2021-12-27 ENCOUNTER — Encounter: Payer: Self-pay | Admitting: Family Medicine

## 2021-12-27 ENCOUNTER — Ambulatory Visit (INDEPENDENT_AMBULATORY_CARE_PROVIDER_SITE_OTHER): Payer: Managed Care, Other (non HMO) | Admitting: Family Medicine

## 2021-12-27 VITALS — BP 134/76 | HR 82 | Ht 63.0 in | Wt 252.6 lb

## 2021-12-27 DIAGNOSIS — Z23 Encounter for immunization: Secondary | ICD-10-CM | POA: Diagnosis not present

## 2021-12-27 DIAGNOSIS — K219 Gastro-esophageal reflux disease without esophagitis: Secondary | ICD-10-CM | POA: Diagnosis not present

## 2021-12-27 DIAGNOSIS — Z124 Encounter for screening for malignant neoplasm of cervix: Secondary | ICD-10-CM | POA: Insufficient documentation

## 2021-12-27 DIAGNOSIS — D492 Neoplasm of unspecified behavior of bone, soft tissue, and skin: Secondary | ICD-10-CM | POA: Diagnosis not present

## 2021-12-27 DIAGNOSIS — Z1211 Encounter for screening for malignant neoplasm of colon: Secondary | ICD-10-CM | POA: Insufficient documentation

## 2021-12-27 DIAGNOSIS — Z0000 Encounter for general adult medical examination without abnormal findings: Secondary | ICD-10-CM

## 2021-12-27 DIAGNOSIS — C4491 Basal cell carcinoma of skin, unspecified: Secondary | ICD-10-CM | POA: Insufficient documentation

## 2021-12-27 DIAGNOSIS — M65331 Trigger finger, right middle finger: Secondary | ICD-10-CM

## 2021-12-27 DIAGNOSIS — J4540 Moderate persistent asthma, uncomplicated: Secondary | ICD-10-CM | POA: Diagnosis not present

## 2021-12-27 HISTORY — DX: Basal cell carcinoma of skin, unspecified: C44.91

## 2021-12-27 LAB — POCT GLYCOSYLATED HEMOGLOBIN (HGB A1C): Hemoglobin A1C: 5.2 % (ref 4.0–5.6)

## 2021-12-27 MED ORDER — OMEPRAZOLE 40 MG PO CPDR: DELAYED_RELEASE_CAPSULE | ORAL | 3 refills | Status: DC

## 2021-12-27 MED ORDER — FLUTICASONE PROPIONATE HFA 44 MCG/ACT IN AERO: 2.0000 | INHALATION_SPRAY | Freq: Every day | RESPIRATORY_TRACT | 4 refills | Status: DC

## 2021-12-27 MED ORDER — SEMAGLUTIDE-WEIGHT MANAGEMENT 0.25 MG/0.5ML ~~LOC~~ SOAJ: 0.2500 mg | SUBCUTANEOUS | 0 refills | Status: DC

## 2021-12-27 NOTE — Progress Notes (Addendum)
? ? ?  SUBJECTIVE:  ? ?CHIEF COMPLAINT / HPI:  ? ?Annual physical ?Acute problems none ?Chronic problems ?Asthma.  Lots of trouble last year.  No recent troubles.  Concern is that she is not using steroid nebulizer daily.  Not really on a regular controler. ?Concern for a skin lesion on posterior right hip.  Growing.  Present for ~ 1 year.  Scratched recently and it bled a copious amount. ?Morbid obesity.  She has tried diet and exercise.  Not successful.  Not interested in bariatric surgery.  She is interested in the new wt loss drugs (GLP1) ?Chronic ear pain.  Did not address.  She has an ENT appoitntment.  ?1 year of irregular menses.  No hot flashes.  ?HPDP: Overdue for Pap.  Just due for colonoscopy.  Already doing breat cancer screening.  Due for lipids.  Declines covid.  Due for tetanus booster, which she is willing to take.   ?No at risk behaviors ?PERTINENT  PMH / PSH: Denies CP, SOB, Abd pain, bleeding, weakness or numbness.  Denies change in bowel, bladder, appetite or weight.   ? ?OBJECTIVE:  ? ?BP 134/76   Pulse 82   Ht '5\' 3"'$  (1.6 m)   Wt 252 lb 9.6 oz (114.6 kg)   LMP 10/10/2021 Comment: once or twice in past year  SpO2 96%   BMI 44.75 kg/m?   ?Neck supple ?Lungs clear ?Cardiac RRR without m or g ?Abd benign ?Pelvic WNL pap taken ?Skin, fleshy broad based skin lesion on right post iliac crest.  Regular borders.   ?Neuro, Motor, sensory, gait, cognition and affect grossly normal. ? ?ASSESSMENT/PLAN:  ? ?Trigger finger, right middle finger ?Does not desire hand surgeon referral at this time. ? ?Skin neoplasm ?Left hip with abrasion.  Likely benign but because growing and bleeds easily, will plan excisional biopsy. ? ?Preventative health care ?Healthy woman with no at risk behaviors.  Morbid obesity is her major long term health threat.  Updated several HPDP interventions today. ? ?Colon cancer screening ?GI referral for screening colonoscopy. ? ?Morbid obesity ([place]) ?Start GLP1.  We will see if  insurance covers.  Insurance will not cover.  Patient cannot afford. ? ?Asthma ?Attempted to prescribe flovent - not covered by insurance.  Patient tells me insurance dose cover brand name advair.  ? ? ?Zenia Resides, MD ?[place]  ?

## 2021-12-27 NOTE — Assessment & Plan Note (Addendum)
Healthy woman with no at risk behaviors.  Morbid obesity is her major long term health threat.  Updated several HPDP interventions today. ?Pap normal and HPV neg.  Informed.  Pap frequency now q5y.   ?

## 2021-12-27 NOTE — Assessment & Plan Note (Addendum)
Start GLP1.  We will see if insurance covers.  Insurance will not cover.  Patient cannot afford. ?

## 2021-12-27 NOTE — Assessment & Plan Note (Signed)
GI referral for screening colonoscopy. ?

## 2021-12-27 NOTE — Assessment & Plan Note (Addendum)
Left hip with abrasion.  Likely benign but because growing and bleeds easily, will plan excisional biopsy. ?

## 2021-12-27 NOTE — Assessment & Plan Note (Signed)
Does not desire hand surgeon referral at this time. ?

## 2021-12-27 NOTE — Patient Instructions (Signed)
I will call with blood test results ?Make an appointment at your convenience for me for me to remove that skin bump. ?Use your steroid inhaler daily. ?Someone should call to schedule the colonoscopy. ?I sent in the prescription for the weight loss shot.  Only one month (for doses) because we steadily increase the dose every month.  We will see if your insurance covers. ?

## 2021-12-28 LAB — LIPID PANEL
Chol/HDL Ratio: 2.6 ratio (ref 0.0–4.4)
Cholesterol, Total: 129 mg/dL (ref 100–199)
HDL: 49 mg/dL (ref >39)
LDL Chol Calc (NIH): 67 mg/dL (ref 0–99)
Triglycerides: 58 mg/dL (ref 0–149)
VLDL Cholesterol Cal: 13 mg/dL (ref 5–40)

## 2021-12-28 LAB — TSH: TSH: 0.879 u[IU]/mL (ref 0.450–4.500)

## 2021-12-28 MED ORDER — ADVAIR DISKUS 100-50 MCG/ACT IN AEPB: 1.0000 | INHALATION_SPRAY | Freq: Two times a day (BID) | RESPIRATORY_TRACT | 12 refills | Status: DC

## 2021-12-28 NOTE — Assessment & Plan Note (Signed)
Attempted to prescribe flovent - not covered by insurance.  Patient tells me insurance dose cover brand name advair. ?

## 2021-12-28 NOTE — Addendum Note (Signed)
Addended by: Zenia Resides on: 12/28/2021 11:04 AM ? ? Modules accepted: Orders ? ?

## 2021-12-31 ENCOUNTER — Other Ambulatory Visit (HOSPITAL_COMMUNITY): Payer: Self-pay

## 2021-12-31 LAB — CYTOLOGY - PAP
Comment: NEGATIVE
Diagnosis: NEGATIVE
High risk HPV: NEGATIVE

## 2022-01-14 ENCOUNTER — Encounter: Payer: Self-pay | Admitting: Family Medicine

## 2022-01-14 ENCOUNTER — Ambulatory Visit: Payer: Managed Care, Other (non HMO) | Admitting: Family Medicine

## 2022-01-14 VITALS — BP 134/85 | HR 87 | Ht 63.0 in | Wt 250.0 lb

## 2022-01-14 DIAGNOSIS — D492 Neoplasm of unspecified behavior of bone, soft tissue, and skin: Secondary | ICD-10-CM

## 2022-01-14 NOTE — Patient Instructions (Signed)
Keep the incision covered and use neosporin.   ?Ice pack would be good today ?Tylenol or ibuprofen for the pain ?I will take the stitches out in 10 days ?OK to shower after 3 days.  Do not soak the incision.  Cover as best you can.  OK to pad dry then apply neosporin and a clean bandaide. ?

## 2022-01-14 NOTE — Progress Notes (Signed)
? ? ?  SUBJECTIVE:  ? ?CHIEF COMPLAINT / HPI:  ? ?FU skin lesion noted on annual physical.  Patient pointed to a lesion on her left buttock that she said had been growing and bled easily.  I asked her to return for a biopsy. ? ? ? ? ?OBJECTIVE:  ? ?BP 134/85   Pulse 87   Ht '5\' 3"'$  (1.6 m)   Wt 250 lb (113.4 kg)   SpO2 98%   BMI 44.29 kg/m?   ?Lesion is raised 2.5 cm x 1.0 cm.  Varied colors.  Mostly smooth border.  Minimal pigment.  Likely a benign lesion but size, growth and varied colors made me worry about neoplasm.  Proposed excisional biopsy ? ?Informed consent obtained. ?Time out taken ?Sterile prep ?Xylocaine with epi anesthesia. ?Eliptical excisional biopsy done.  Narrow rather than wide margins. ?Closed with 6 4-o nylon sutures  ?Steriostrips applied.   ?EBL 2 cc ? ?ASSESSMENT/PLAN:  ? ?No problem-specific Assessment & Plan notes found for this encounter. ?  ? ? ?Zenia Resides, MD ?[place]  ?

## 2022-01-14 NOTE — Assessment & Plan Note (Signed)
Await pathology.  Full specimen sent. ?

## 2022-01-24 ENCOUNTER — Encounter: Payer: Self-pay | Admitting: Family Medicine

## 2022-01-24 ENCOUNTER — Ambulatory Visit (INDEPENDENT_AMBULATORY_CARE_PROVIDER_SITE_OTHER): Payer: Managed Care, Other (non HMO) | Admitting: Family Medicine

## 2022-01-24 ENCOUNTER — Other Ambulatory Visit (HOSPITAL_COMMUNITY): Payer: Self-pay

## 2022-01-24 DIAGNOSIS — C44519 Basal cell carcinoma of skin of other part of trunk: Secondary | ICD-10-CM | POA: Diagnosis not present

## 2022-01-24 MED ORDER — MOUNJARO 2.5 MG/0.5ML ~~LOC~~ SOAJ: 2.5000 mg | SUBCUTANEOUS | 0 refills | Status: DC

## 2022-01-24 NOTE — Assessment & Plan Note (Addendum)
Given option of wider re-excision or close follow up.  She will think about it and get back with me. ? ?Wound redness dose not look like infection.  Perhaps neomycin topical reaction from neosporin.  Instructed how to test in another location (arm) ?

## 2022-01-24 NOTE — Progress Notes (Signed)
? ? ?  SUBJECTIVE:  ? ?CHIEF COMPLAINT / HPI:  ? ?Suture removal and more: ?Has done wound care as instructed.  Using neosporin ointment.  Some irritation.  No drainage. ?Path report shows basal cell with margin involved.  Educated about Basal cell (yes cancer, but very low grade) and idea of margins. ?Morbid obesity.  Semaglutide was not covered.  She has done some research and thinks mounjaro is an affordable option ? ? ? ?OBJECTIVE:  ? ?BP 131/75   Pulse 85   Ht '5\' 3"'$  (1.6 m)   Wt 247 lb 9.6 oz (112.3 kg)   SpO2 97%   BMI 43.86 kg/m?   ?Wound is well healed but dose have some surrounding erythema.  Six sutures were removed without difficulty. ? ?ASSESSMENT/PLAN:  ? ?Basal cell carcinoma (BCC) ?Given option of wider re-excision or close follow up.  She will think about it and get back with me. ? ?Wound redness dose not look like infection.  Perhaps neomycin topical reaction from neosporin.  Instructed how to test in another location (arm) ? ?Morbid obesity ([place]) ?Trial of mounjaro.  Start low dose.  Increase after 4 doses if tolerating well. ?  ? ? ?Zenia Resides, MD ?[place]  ?

## 2022-01-24 NOTE — Patient Instructions (Addendum)
You have a basal cell carcinoma.  This is a skin cancer but it is the least worrisome of all the skin cancers.  Because the margin was involved, we need to either: ?Keep a close eye on it and cut more out if any sign of recurrence.  ?Cut more out now.   ?Remember to test yourself to see if you are allergic to neosporin.  If you are, call me and I will give you a prescription for Bactroban/mupirocin.   ?I will send in a prescription for Mounjaro.  I sent in four doses and no refills.  After four doses, we will need to decide about an increase in the dose.   ?

## 2022-01-24 NOTE — Assessment & Plan Note (Signed)
Trial of mounjaro.  Start low dose.  Increase after 4 doses if tolerating well. ?

## 2022-01-28 ENCOUNTER — Telehealth: Payer: Self-pay

## 2022-01-28 MED ORDER — MUPIROCIN 2 % EX OINT: 1.0000 "application " | TOPICAL_OINTMENT | Freq: Two times a day (BID) | CUTANEOUS | 2 refills | Status: DC

## 2022-01-28 NOTE — Telephone Encounter (Signed)
Patient calls nurse line reporting allergic reaction to neosporin.  ? ?Patient reports she did the "patch test" over the weekend and noted irritation.  ? ?Patient requesting antibiotic ointment.  ? ?Will forward to PCP.  ?

## 2022-01-28 NOTE — Addendum Note (Signed)
Addended by: Zenia Resides on: 01/28/2022 02:44 PM ? ? Modules accepted: Orders ? ?

## 2022-01-28 NOTE — Telephone Encounter (Signed)
A Prior Authorization was initiated for this patients MOUNJARO through CoverMyMeds.  ? ?Key: [id number] ? ?

## 2022-01-28 NOTE — Telephone Encounter (Signed)
Added neomycin to allergy list and sent in Rx from mupirocin. ?

## 2022-02-13 ENCOUNTER — Other Ambulatory Visit (HOSPITAL_COMMUNITY): Payer: Self-pay

## 2022-02-18 NOTE — Telephone Encounter (Signed)
Prior Auth for patients medication MOUNJARO denied by [employer] via CoverMyMeds.  ? ?Reason: no indication of type 2 diabetes. [name] is considered medically necessary when the following requirements are all met: type 2 diabetes, unable to achieve goal hba1c despite metformin or metformin-containing regimen, or contraindication to metformin ? ?CoverMyMeds Key: [id number] ? ?Denial letter scanned to patients chart ? ?

## 2022-02-27 ENCOUNTER — Other Ambulatory Visit: Payer: Self-pay | Admitting: Family Medicine

## 2022-02-27 DIAGNOSIS — K219 Gastro-esophageal reflux disease without esophagitis: Secondary | ICD-10-CM

## 2022-03-12 ENCOUNTER — Encounter: Payer: Self-pay | Admitting: *Deleted

## 2022-04-01 ENCOUNTER — Other Ambulatory Visit (HOSPITAL_COMMUNITY): Payer: Self-pay

## 2022-05-03 ENCOUNTER — Telehealth: Payer: Self-pay

## 2022-05-03 ENCOUNTER — Other Ambulatory Visit (HOSPITAL_COMMUNITY): Payer: Self-pay

## 2022-05-03 NOTE — Telephone Encounter (Signed)
Rec'd another PA request for patients Mounjaro medication. (They have sent multiple requests since the denial in April 2023.  Ran test claim and medication is now covered with $500 copay. Reached out to pharmacy to inform them of coverage. They will let pt know if she attempts to pickup again.Marland Kitchen

## 2022-05-06 ENCOUNTER — Other Ambulatory Visit (HOSPITAL_COMMUNITY): Payer: Self-pay

## 2022-05-27 ENCOUNTER — Telehealth: Payer: Self-pay | Admitting: Family Medicine

## 2022-05-27 NOTE — Telephone Encounter (Signed)
Patient called back about the spot that was removed on the lower part of her back near the pantie line. She stated with her sitting all day and getting up and down all the time it its bothering her. She stated if you could do it that it would be fine, she is just worried and doesn't want to continue to have to go through this wanting to get it all. She wanted to also know if it could be done on a Friday so she would have time to heal due to the pain last time being horrible- possible referral out(last resort).. I told her I would put the message back in the chart and if you need to contact her she said that was fine.  Please advise.  Thanks!

## 2022-05-28 NOTE — Telephone Encounter (Signed)
Contacted pt and gave her the below information and she said she is really hoping that she could be like a work in with you on a Friday, so she can be home for the time after the procedure due to having so much pain last time.  Anuar Walgren Tacy Dura Rumple, CMA      Hensel, Jamal Collin, MD  Katharina Caper, Sherwood Castilla D, CMA Caller: Unspecified (Yesterday, 11:03 AM) I can do this and am booked out for a couple of weeks.  The first time I have open on my schedule is Sept 7.  Please offer her that appointment.  If that does not work, I will call her and try to work something out.

## 2022-06-21 ENCOUNTER — Ambulatory Visit: Payer: Managed Care, Other (non HMO) | Admitting: Family Medicine

## 2022-06-21 ENCOUNTER — Encounter: Payer: Self-pay | Admitting: Family Medicine

## 2022-06-21 DIAGNOSIS — R609 Edema, unspecified: Secondary | ICD-10-CM | POA: Insufficient documentation

## 2022-06-21 DIAGNOSIS — I872 Venous insufficiency (chronic) (peripheral): Secondary | ICD-10-CM | POA: Insufficient documentation

## 2022-06-21 DIAGNOSIS — R748 Abnormal levels of other serum enzymes: Secondary | ICD-10-CM

## 2022-06-21 DIAGNOSIS — C44519 Basal cell carcinoma of skin of other part of trunk: Secondary | ICD-10-CM | POA: Diagnosis not present

## 2022-06-21 DIAGNOSIS — N951 Menopausal and female climacteric states: Secondary | ICD-10-CM | POA: Insufficient documentation

## 2022-06-21 NOTE — Assessment & Plan Note (Signed)
Clinical dx is chronic venous insufficiency.  Will check labs for unlikely renal, liver or thyroid disease.  Discussed low salt diet and leg elevation and compression stockings.

## 2022-06-21 NOTE — Assessment & Plan Note (Signed)
Reexcised without difficulty.  Specimen sent to path.

## 2022-06-21 NOTE — Progress Notes (Signed)
    SUBJECTIVE:   CHIEF COMPLAINT / HPI:   Main issue is that she is here for reexcision of basal cell Ca on buttocks.  I excised on 01/14/22 and margins were not clear. Also, C/O hot flashes.  No menses since 1/23.  Asks about estrogens.  Has not tried anything yet. Chronic, worsening dependent edema.  Both legs swell during day and go down at night.  Nocturia is a problem.  No recent labs to review. Skin lesion on foot.    OBJECTIVE:   BP 124/72   Pulse 91   Ht '5\' 3"'$  (1.6 m)   Wt 252 lb (114.3 kg)   SpO2 100%   BMI 44.64 kg/m   Lungs clear Cardiac RRR without m or g Ext 2+ pitting edema bilaterally Plantar wart on foot.  Scar on right buttocks at post iliac crest well healed.   Informed consent obtained. Sterile prep and drape. Xylocaine with epi anesthesia. Reexcised scar.  No obvious tumor.  Sent to path. Closed with 3-0 nylon suture. Patient tolerated the procedure well.  ASSESSMENT/PLAN:   Dependent edema Clinical dx is chronic venous insufficiency.  Will check labs for unlikely renal, liver or thyroid disease.  Discussed low salt diet and leg elevation and compression stockings.    Basal cell carcinoma (BCC) Reexcised without difficulty.  Specimen sent to path.  Menopausal symptoms Educated on long term problems of estrogen therapy.  Will try soy and black cohosh.       Zenia Resides, MD [place]

## 2022-06-21 NOTE — Assessment & Plan Note (Signed)
Educated on long term problems of estrogen therapy.  Will try soy and black cohosh.

## 2022-06-21 NOTE — Patient Instructions (Addendum)
You have a wart on your foot.  OK to leave alone, file off or try compound W.  Duct tape also works a little. For the hot flashes, try increase in soy products and/or black cohosh.  I will call with blood test results.  Come back in in 10 days for suture removal.

## 2022-06-22 DIAGNOSIS — K8020 Calculus of gallbladder without cholecystitis without obstruction: Secondary | ICD-10-CM | POA: Insufficient documentation

## 2022-06-22 DIAGNOSIS — R748 Abnormal levels of other serum enzymes: Secondary | ICD-10-CM | POA: Insufficient documentation

## 2022-06-22 HISTORY — DX: Calculus of gallbladder without cholecystitis without obstruction: K80.20

## 2022-06-22 HISTORY — DX: Hypercalcemia: E83.52

## 2022-06-22 LAB — CMP14+EGFR
ALT: 28 IU/L (ref 0–32)
AST: 18 IU/L (ref 0–40)
Albumin/Globulin Ratio: 2 (ref 1.2–2.2)
Albumin: 4.5 g/dL (ref 3.9–4.9)
Alkaline Phosphatase: 224 IU/L — ABNORMAL HIGH (ref 44–121)
BUN/Creatinine Ratio: 21 (ref 9–23)
BUN: 16 mg/dL (ref 6–24)
Bilirubin Total: 0.2 mg/dL (ref 0.0–1.2)
CO2: 20 mmol/L (ref 20–29)
Calcium: 12.2 mg/dL — ABNORMAL HIGH (ref 8.7–10.2)
Chloride: 105 mmol/L (ref 96–106)
Creatinine, Ser: 0.76 mg/dL (ref 0.57–1.00)
Globulin, Total: 2.3 g/dL (ref 1.5–4.5)
Glucose: 97 mg/dL (ref 70–99)
Potassium: 4.3 mmol/L (ref 3.5–5.2)
Sodium: 140 mmol/L (ref 134–144)
Total Protein: 6.8 g/dL (ref 6.0–8.5)
eGFR: 98 mL/min/{1.73_m2} (ref >59)

## 2022-06-22 LAB — CBC
Hematocrit: 42.8 % (ref 34.0–46.6)
Hemoglobin: 14.5 g/dL (ref 11.1–15.9)
MCH: 30.3 pg (ref 26.6–33.0)
MCHC: 33.9 g/dL (ref 31.5–35.7)
MCV: 90 fL (ref 79–97)
Platelets: 264 10*3/uL (ref 150–450)
RBC: 4.78 x10E6/uL (ref 3.77–5.28)
RDW: 11.6 % — ABNORMAL LOW (ref 11.7–15.4)
WBC: 9.7 10*3/uL (ref 3.4–10.8)

## 2022-06-22 LAB — TSH: TSH: 0.798 u[IU]/mL (ref 0.450–4.500)

## 2022-06-22 NOTE — Assessment & Plan Note (Signed)
Called, will repeat in 3 months.

## 2022-06-22 NOTE — Assessment & Plan Note (Signed)
Called.  "I have had trouble with my gall bladder for years."  Repeat in 3 months.

## 2022-06-22 NOTE — Addendum Note (Signed)
Addended by: Zenia Resides on: 06/22/2022 12:15 PM   Modules accepted: Orders

## 2022-06-24 ENCOUNTER — Telehealth: Payer: Self-pay

## 2022-06-24 NOTE — Telephone Encounter (Signed)
Patient calls nurse line in regards to suture removal.   Patient has an apt for removal with Mabe on 9/26, however prefers to see Dr. Andria Frames.   Patient is asking to be "worked in" on PCP schedule for 9/25. Patient reports she was told he was seeing patients this day.   Patient stated, "I really do not want to see anyone else for this."   Will forward to PCP.

## 2022-06-25 NOTE — Telephone Encounter (Signed)
Attempted to call patient back to schedule.   VM left asking her to return my call.

## 2022-06-25 NOTE — Telephone Encounter (Signed)
Patient scheduled for 9/25 '@11am'$ .

## 2022-07-01 ENCOUNTER — Ambulatory Visit (INDEPENDENT_AMBULATORY_CARE_PROVIDER_SITE_OTHER): Payer: Managed Care, Other (non HMO) | Admitting: Family Medicine

## 2022-07-01 ENCOUNTER — Encounter: Payer: Self-pay | Admitting: Family Medicine

## 2022-07-01 DIAGNOSIS — C44519 Basal cell carcinoma of skin of other part of trunk: Secondary | ICD-10-CM

## 2022-07-01 NOTE — Patient Instructions (Signed)
The pathology report was good.  The incision looks fine.  It should heal up great from here. Good luck with [name].  I think it should be checked out, but I will be shocked if there is anything important wrong with her heart or lungs.   See me in December for blood work.

## 2022-07-02 ENCOUNTER — Encounter: Payer: Self-pay | Admitting: Family Medicine

## 2022-07-02 ENCOUNTER — Ambulatory Visit: Payer: Managed Care, Other (non HMO) | Admitting: Family Medicine

## 2022-07-02 NOTE — Progress Notes (Signed)
    SUBJECTIVE:   CHIEF COMPLAINT / HPI:   FU reexcision of a previous basal cell ca.  Patient has had no problems with the site.  She is already aware that the pathology report showed no residual tumor and good margins.    OBJECTIVE:   BP 136/87   Pulse 87   Ht '5\' 3"'$  (1.6 m)   Wt 250 lb 3.2 oz (113.5 kg)   SpO2 99%   BMI 44.32 kg/m   Site is clear, dry and non erythematous.  Sutures removed without difficulty.  Instructed on local care.  ASSESSMENT/PLAN:   Basal cell carcinoma (BCC) Should be cured.  No further FU needed.  Of course, patient will keep an eye on the site.     Zenia Resides, MD [place]

## 2022-07-02 NOTE — Assessment & Plan Note (Signed)
Should be cured.  No further FU needed.  Of course, patient will keep an eye on the site.

## 2022-07-04 ENCOUNTER — Ambulatory Visit: Payer: Managed Care, Other (non HMO) | Admitting: Family Medicine

## 2022-07-29 ENCOUNTER — Other Ambulatory Visit: Payer: Self-pay | Admitting: Family Medicine

## 2022-07-29 ENCOUNTER — Ambulatory Visit
Admission: RE | Admit: 2022-07-29 | Discharge: 2022-07-29 | Disposition: A | Discharge status: Home / Self Care | Payer: Managed Care, Other (non HMO) | Source: Ambulatory Visit | Attending: Family Medicine | Admitting: Family Medicine

## 2022-07-29 DIAGNOSIS — Z1231 Encounter for screening mammogram for malignant neoplasm of breast: Secondary | ICD-10-CM

## 2022-08-15 ENCOUNTER — Ambulatory Visit (INDEPENDENT_AMBULATORY_CARE_PROVIDER_SITE_OTHER): Payer: Managed Care, Other (non HMO) | Admitting: Family Medicine

## 2022-08-15 ENCOUNTER — Encounter: Payer: Self-pay | Admitting: Family Medicine

## 2022-08-15 ENCOUNTER — Telehealth: Payer: Self-pay

## 2022-08-15 VITALS — BP 139/94 | HR 75 | Ht 63.0 in | Wt 253.6 lb

## 2022-08-15 DIAGNOSIS — I10 Essential (primary) hypertension: Secondary | ICD-10-CM

## 2022-08-15 DIAGNOSIS — R030 Elevated blood-pressure reading, without diagnosis of hypertension: Secondary | ICD-10-CM

## 2022-08-15 DIAGNOSIS — M26623 Arthralgia of bilateral temporomandibular joint: Secondary | ICD-10-CM

## 2022-08-15 HISTORY — DX: Arthralgia of bilateral temporomandibular joint: M26.623

## 2022-08-15 MED ORDER — BD ASSURE BPM/MANUAL ARM CUFF MISC: 1.0000 [IU] | Freq: Every day | 0 refills | Status: DC

## 2022-08-15 NOTE — Progress Notes (Signed)
    SUBJECTIVE:   CHIEF COMPLAINT / HPI:   Elevated BP reading: Patient presents after an elevated BP reading to 145/102 at ENT office earlier today. States she has never had high blood pressure and does not take medication for it. Does not check her BP at home and does not have a cuff. Associated with temporal headache x 2 days. Denies changes in vision or chest pain. Patient states she was outside a lot this past weekend but takes her allergy medication regularly. HA relieved by Motrin.  PERTINENT  PMH / PSH: Allergic rhinitis  OBJECTIVE:   BP (!) 139/94   Pulse 75   Ht '5\' 3"'$  (1.6 m)   Wt 253 lb 9.6 oz (115 kg)   SpO2 98%   BMI 44.92 kg/m    General: NAD, pleasant, able to participate in exam HEENT: PERRLA, EOMI, white sclera Cardiac: RRR, no murmurs. Respiratory: CTAB, normal effort, No wheezes, rales or rhonchi Extremities: No edema or cyanosis. Skin: Warm and dry, no rashes noted Neuro: CN intact. No obvious focal deficits. Motor and sensation intact globally. Psych: Normal affect and mood  ASSESSMENT/PLAN:   Elevated blood pressure reading in office with diagnosis of hypertension 145/102 at ENT office early today. 139/94 on repeat today. Advised to keep a log at home with upper arm cuff and f/u with PCP next month. BP cuff sent to pharmacy. HA likely related to seasonal allergies.   Patient declined flu shot  Dr. Colletta [place], [place]

## 2022-08-15 NOTE — Assessment & Plan Note (Addendum)
145/102 at ENT office early today. 139/94 on repeat today. Advised to keep a log at home with upper arm cuff and f/u with PCP next month. BP cuff sent to pharmacy. HA likely related to seasonal allergies.

## 2022-08-15 NOTE — Patient Instructions (Signed)
It was wonderful to see you today.  Please bring ALL of your medications with you to every visit.   Today we talked about:  Please keep a log of your blood pressure at home and follow up with Dr. Andria Frames next month to discuss. I sent in a blood pressure cuff to your pharmacy but you can also find them online or at [facility], [facility], or [facility] over the counter. Please follow up if your headache is getting worse or has not resolved in the next week for further evaluation.  Please follow up in 1 month for labs with Dr. Andria Frames  Thank you for choosing [facility].   Please call [phone] with any questions about today's appointment.  Please be sure to schedule follow up at the front desk before you leave today.   Colletta Maryland, DO Family Medicine      Blood Pressure Record Sheet To take your blood pressure, you will need a blood pressure machine. You can buy a blood pressure machine (blood pressure monitor) at your clinic, drug store, or online. When choosing one, consider: An automatic monitor that has an arm cuff. A cuff that wraps snugly around your upper arm. You should be able to fit only one finger between your arm and the cuff. A device that stores blood pressure reading results. Do not choose a monitor that measures your blood pressure from your wrist or finger. Follow your health care provider's instructions for how to take your blood pressure. To use this form: Take your blood pressure medications every day These measurements should be taken when you have been at rest for at least 10-15 min Take at least 2 readings with each blood pressure check. This makes sure the results are correct. Wait 1-2 minutes between measurements. Write down the results in the spaces on this form. Keep in mind it should always be recorded systolic over diastolic. Both numbers are important.  Repeat this every day for 2-3 weeks, or as told by your health care provider.  Make a  follow-up appointment with your health care provider to discuss the results.  Blood Pressure Log Date Medications taken? (Y/N) Blood Pressure Time of Day

## 2022-08-15 NOTE — Telephone Encounter (Signed)
Patient calls nurse line in regards to blood pressure.   Patient reports she was seen today at ENT and her BP was "high." I reviewed ENT notes and BP was 145/102.  Patient reports she is not on blood pressure medication. She does endorse a headache that has been persistent for a few days.   Patient denies any vision changes or SOB at this time.   Patient scheduled for this afternoon for evaluation.

## 2022-09-09 ENCOUNTER — Ambulatory Visit: Payer: Managed Care, Other (non HMO) | Admitting: Family Medicine

## 2022-09-09 ENCOUNTER — Encounter: Payer: Self-pay | Admitting: Family Medicine

## 2022-09-09 DIAGNOSIS — I872 Venous insufficiency (chronic) (peripheral): Secondary | ICD-10-CM | POA: Diagnosis not present

## 2022-09-09 DIAGNOSIS — K8020 Calculus of gallbladder without cholecystitis without obstruction: Secondary | ICD-10-CM

## 2022-09-09 DIAGNOSIS — Z1211 Encounter for screening for malignant neoplasm of colon: Secondary | ICD-10-CM

## 2022-09-09 MED ORDER — FUROSEMIDE 20 MG PO TABS: 20.0000 mg | ORAL_TABLET | Freq: Every day | ORAL | 3 refills | Status: DC

## 2022-09-09 NOTE — Patient Instructions (Addendum)
I will call about the calcium blood test results.  Expect it to take several days. Think about colon cancer screening.  Options are: Colonoscopy - once every ten years Cologuard - once every three years.  A poop test FIT testing we do here- a poop test Call your insurance company rep and ask if they cover ANY GLP1 meds for weight loss.   We will try a once a day fluid pill for your swelling.

## 2022-09-10 ENCOUNTER — Encounter: Payer: Self-pay | Admitting: Family Medicine

## 2022-09-10 LAB — PTH, INTACT AND CALCIUM
Calcium: 12.8 mg/dL — ABNORMAL HIGH (ref 8.7–10.2)
PTH: 154 pg/mL — ABNORMAL HIGH (ref 15–65)

## 2022-09-10 NOTE — Assessment & Plan Note (Signed)
Discussed various options - see AVS.  She will decide and let me know.

## 2022-09-10 NOTE — Assessment & Plan Note (Signed)
Needs weight loss for health reasons: hypertension and knees.  She will check with her insurer and see if they cover any GLP1

## 2022-09-10 NOTE — Addendum Note (Signed)
Addended by: Zenia Resides on: 09/10/2022 04:11 PM   Modules accepted: Orders

## 2022-09-10 NOTE — Progress Notes (Signed)
    SUBJECTIVE:   CHIEF COMPLAINT / HPI:   Several issues FU high calcium.  Will repeat and get TSH.  No evident symptoms. Elevated Alk phos.  Has longstanding GB issues.  Sludge and likely stones noted on ultrasound many years ago.  She has delayed getting a cholecystectomy.  She sees no compelling reason.  Her gall bladder becomes mildly symptomatic several times per year.  She knows how to handle these flares.  Given her stable symptoms and desire to delay cholecystectomy, I see no reason to repeat labs or imaging at this time. Symptomatic leg edema from presumed chronic venous insufficiency.  She has tried elevation and compression.  Would like some relief. Morbid obesity.  Mostly symptomatic with severe bilateral knee pain.  S/P left knee replacement and a right knee placement has been recommended.  Thus far, her insurance has refused coverage of GLP1 She is aware of need for colon cancer screen.    OBJECTIVE:   BP 128/76   Pulse 86   Wt 252 lb 9.6 oz (114.6 kg)   SpO2 97%   BMI 44.75 kg/m   Lungs clear Cardiac RRR without m or g Ext 3+ bilateral pitting edema  ASSESSMENT/PLAN:   Morbid obesity (HCC) Needs weight loss for health reasons: hypertension and knees.  She will check with her insurer and see if they cover any GLP1  Cholelithiases No further WU of elevated alk phos at this time.  Colon cancer screening Discussed various options - see AVS.  She will decide and let me know.  Hypercalcemia Check repeat calcium and parathyroid hormone level.     William A Hensel, MD [facility]  

## 2022-09-10 NOTE — Assessment & Plan Note (Signed)
No further WU of elevated alk phos at this time.

## 2022-09-10 NOTE — Assessment & Plan Note (Addendum)
Check repeat calcium and parathyroid hormone level. Both elevated suggesting hyperparathyroidism.  Patient informed and will order parathyroid scan

## 2022-09-20 ENCOUNTER — Ambulatory Visit (HOSPITAL_COMMUNITY)
Admission: RE | Admit: 2022-09-20 | Discharge: 2022-09-20 | Disposition: A | Discharge status: Home / Self Care | Payer: Managed Care, Other (non HMO) | Source: Ambulatory Visit | Attending: Family Medicine | Admitting: Family Medicine

## 2022-09-20 ENCOUNTER — Telehealth: Payer: Self-pay

## 2022-09-20 ENCOUNTER — Encounter (HOSPITAL_COMMUNITY): Payer: Managed Care, Other (non HMO)

## 2022-09-20 ENCOUNTER — Encounter (HOSPITAL_COMMUNITY): Payer: Self-pay

## 2022-09-20 NOTE — Telephone Encounter (Signed)
Received call from [facility] at Nuclear Medicine regarding order for parathyroid with spect CT.   This cannot be completed at [facility], as they do not have a Spect CT.   Order will need to be updated to [facility] or [facility]. .   Spoke with Jazmin and order was updated. Dr. McDiarmid has cosigned order.   Patient has been rescheduled for Monday morning.   Talbot Grumbling, RN

## 2022-09-20 NOTE — Addendum Note (Signed)
Addended by: Valerie Roys on: 09/20/2022 09:24 AM   Modules accepted: Orders

## 2022-09-23 ENCOUNTER — Telehealth: Payer: Self-pay

## 2022-09-23 ENCOUNTER — Ambulatory Visit (HOSPITAL_COMMUNITY)
Admission: RE | Admit: 2022-09-23 | Discharge: 2022-09-23 | Disposition: A | Discharge status: Home / Self Care | Payer: Managed Care, Other (non HMO) | Source: Ambulatory Visit | Attending: Family Medicine | Admitting: Family Medicine

## 2022-09-23 ENCOUNTER — Telehealth: Payer: Self-pay | Admitting: Family Medicine

## 2022-09-23 DIAGNOSIS — D351 Benign neoplasm of parathyroid gland: Secondary | ICD-10-CM

## 2022-09-23 DIAGNOSIS — E210 Primary hyperparathyroidism: Secondary | ICD-10-CM | POA: Insufficient documentation

## 2022-09-23 MED ORDER — TECHNETIUM TC 99M SESTAMIBI - CARDIOLITE
23.8000 | Freq: Once | INTRAVENOUS | Status: AC | PRN
  Administered 2022-09-23: 23.8 via INTRAVENOUS

## 2022-09-23 NOTE — Telephone Encounter (Signed)
Called ms Liebler and discussed scan results showing parathyroid adenoma. Discussed referral to general surgery for removal, which she is agreeable to.  Answerd questions and concerns. Yehuda Savannah MD

## 2022-09-23 NOTE — Telephone Encounter (Signed)
Patient calls nurse line requesting a letter excusing her from work.   She reports she went on Friday to have imaging done, however the test was scheduled at the wrong facility.   Test has been rescheduled for today. She is requesting a note explaining this to her employer so she will not be penalized.   Letter written.

## 2022-09-24 ENCOUNTER — Other Ambulatory Visit: Payer: Self-pay | Admitting: Family Medicine

## 2022-09-24 DIAGNOSIS — J4540 Moderate persistent asthma, uncomplicated: Secondary | ICD-10-CM

## 2022-09-24 MED ORDER — FLUTICASONE-SALMETEROL 55-14 MCG/ACT IN AEPB: 2.0000 | INHALATION_SPRAY | Freq: Two times a day (BID) | RESPIRATORY_TRACT | 1 refills | Status: DC

## 2022-09-26 ENCOUNTER — Other Ambulatory Visit: Payer: Managed Care, Other (non HMO)

## 2022-09-26 ENCOUNTER — Other Ambulatory Visit: Payer: Self-pay | Admitting: Family Medicine

## 2022-09-26 DIAGNOSIS — E210 Primary hyperparathyroidism: Secondary | ICD-10-CM

## 2022-09-26 HISTORY — DX: Primary hyperparathyroidism: E21.0

## 2022-10-04 ENCOUNTER — Telehealth: Payer: Self-pay

## 2022-10-04 DIAGNOSIS — Z20828 Contact with and (suspected) exposure to other viral communicable diseases: Secondary | ICD-10-CM | POA: Insufficient documentation

## 2022-10-04 MED ORDER — OSELTAMIVIR PHOSPHATE 75 MG PO CAPS: 75.0000 mg | ORAL_CAPSULE | Freq: Two times a day (BID) | ORAL | 0 refills | Status: DC

## 2022-10-04 NOTE — Telephone Encounter (Signed)
Household contact positive influenza.  She is asymptomatic.  Tamiflu Rx sent and only pick up if starts developing symptoms.

## 2022-10-04 NOTE — Telephone Encounter (Signed)
Patient calls nurse line requesting tamiflu.   She reports her husband tested positive for the flu this morning and was given tamiflu. She reports his symptoms started on Wednesday evening. She reports her sister also has the flu and is in the ICU now. She reports they were all together on Christmas Day.  She reports she has no symptoms "as of yet." She reports she would like to have the medication on hand if/when symptoms start.   Hand washing and isolation encouraged.   Will forward to PCP.

## 2022-10-25 ENCOUNTER — Other Ambulatory Visit: Payer: Self-pay | Admitting: Surgery

## 2022-10-25 DIAGNOSIS — E210 Primary hyperparathyroidism: Secondary | ICD-10-CM

## 2022-10-29 ENCOUNTER — Telehealth: Payer: Self-pay

## 2022-10-29 DIAGNOSIS — E210 Primary hyperparathyroidism: Secondary | ICD-10-CM

## 2022-10-29 NOTE — Telephone Encounter (Signed)
Pt came into the office. Pt would like to talk to you about recent test results. Dr. Harlow Asa found a nodule and she is worried and would like your thoughts. Also, Dr. Harlow Asa wants her to get a bone scan. Will you order that for her?  Please call her at [phone].  Ottis Stain, CMA

## 2022-10-29 NOTE — Telephone Encounter (Signed)
Two issues: Dr. Harlow Asa may have felt a soft left thyroid nodule on exam.  He has ordered an ultrasound.  I reassured patient that thyroid nodules were common and in general easily treated.  She should definitely follow up with the ultrasound. He felt a bone density was in order given her documented hyperparathyroidism.  This is reasonable and I have ordered.

## 2022-10-29 NOTE — Telephone Encounter (Signed)
Pt can call and schedule as her schedule allows. I have called pt and given her the phone number of [facility]. Ottis Stain, CMA

## 2022-10-31 ENCOUNTER — Ambulatory Visit
Admission: RE | Admit: 2022-10-31 | Discharge: 2022-10-31 | Disposition: A | Discharge status: Home / Self Care | Payer: Managed Care, Other (non HMO) | Source: Ambulatory Visit | Attending: Surgery | Admitting: Surgery

## 2022-10-31 DIAGNOSIS — E210 Primary hyperparathyroidism: Secondary | ICD-10-CM

## 2022-11-06 ENCOUNTER — Ambulatory Visit: Payer: Self-pay | Admitting: Surgery

## 2022-11-06 NOTE — Progress Notes (Signed)
USN confirms the right inferior parathyroid adenoma seen on nuclear scan with sestamibi.  Will plan to proceed with minimally invasive parathyroidectomy as an outpatient procedure as we discussed in the office.  Claiborne Billings - see orders and send to schedulers to contact patient.  USN does show a thyroid nodule that will need follow up in one year with an USN and office visit for physical exam.  Claiborne Billings - please arrange follow up in one year as well.  tmg  Armandina Gemma, [facility] [place] practice Office: [phone]

## 2022-11-11 ENCOUNTER — Telehealth: Payer: Self-pay | Admitting: Surgery

## 2022-11-11 NOTE — Telephone Encounter (Signed)
      Telephone call to patient to discuss upcoming surgery.  Patient informed as to the presence of a 1.6 cm thyroid nodule that would require continued follow-up with repeat ultrasound in 1 year.  Discussed the reason not to remove that at the time of the current surgery for parathyroid disease.  Patient understands.  Plan to proceed with parathyroidectomy as scheduled next week.  Armandina Gemma, MD [facility] [place] practice Office: [phone]

## 2022-11-12 ENCOUNTER — Encounter: Payer: Self-pay | Admitting: Family Medicine

## 2022-11-13 ENCOUNTER — Encounter (HOSPITAL_COMMUNITY): Payer: Self-pay | Admitting: Surgery

## 2022-11-13 DIAGNOSIS — E041 Nontoxic single thyroid nodule: Secondary | ICD-10-CM | POA: Diagnosis present

## 2022-11-13 NOTE — H&P (Signed)
REFERRING PHYSICIAN: Zenia Resides, MD  PROVIDER: Kenzey Birkland Charlotta Newton, MD   Chief Complaint: New Consultation (Primary hyperparathyroidism)  History of Present Illness:  Patient is referred by Dr. Madison Hickman for surgical evaluation and management of newly diagnosed primary hyperparathyroidism. Patient was noted on routine laboratory studies to have an elevated serum calcium level of 12.2. Repeat laboratories 3 months later showed a calcium level of 12.8 and an intact PTH level of 154. Patient underwent nuclear medicine parathyroid scan with sestamibi on September 23, 2022. This was positive for a right inferior parathyroid adenoma. Patient was referred to surgery. Patient has no prior history of parathyroid or thyroid disease. There is a family history of thyroid cancer in the patient's mother who was treated with surgery and radioactive iodine and has done well. Patient has had no prior head or neck surgery. 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 Patient notes significant fatigue. She complains of bone and joint pain. She has a history of nephrolithiasis.  Review of Systems: A complete review of systems was obtained from the patient. I have reviewed this information and discussed as appropriate with the patient. See HPI as well for other ROS.  Review of Systems  Constitutional: Positive for malaise/fatigue.  HENT: Negative.  Eyes: Negative.  Respiratory: Negative.  Cardiovascular: Negative.  Gastrointestinal: Negative.  Genitourinary:  Nephrolithiasis  Musculoskeletal: Positive for back pain and joint pain.  Skin: Negative.  Neurological: Negative.  Endo/Heme/Allergies: Negative.  Psychiatric/Behavioral: The patient has insomnia.    Medical History: Past Medical History:  Diagnosis Date  Anxiety  Asthma, unspecified asthma severity, unspecified whether complicated, unspecified whether persistent   Patient Active Problem List  Diagnosis   Primary hyperparathyroidism (CMS-HCC)   Past Surgical History:  Procedure Laterality Date  APPENDECTOMY  carpel tunnel  JOINT REPLACEMENT  repaire rotator cuff    Allergies  Allergen Reactions  Codeine Hives, Itching and Rash  Dilaudid [Hydromorphone] Other (See Comments)  seizures   Current Outpatient Medications on File Prior to Visit  Medication Sig Dispense Refill  albuterol (ACCUNEB) 1.25 mg/3 mL nebulizer solution Inhale into the lungs  FUROsemide (LASIX) 20 MG tablet Take 20 mg by mouth once daily  omeprazole (PRILOSEC) 40 MG DR capsule Take by mouth  fluticasone propionate (FLONASE) 50 mcg/actuation nasal spray Place into one nostril   No current facility-administered medications on file prior to visit.   Family History  Problem Relation Age of Onset  Stroke Mother  High blood pressure (Hypertension) Mother    Social History   Tobacco Use  Smoking Status Former  Types: Cigarettes  Smokeless Tobacco Never    Social History   Socioeconomic History  Marital status: Married  Tobacco Use  Smoking status: Former  Types: Cigarettes  Smokeless tobacco: Never  Substance and Sexual Activity  Alcohol use: Not Currently  Drug use: Never   Objective:   Vitals:  BP: 134/85  Pulse: 108  Temp: 36.7 C (98 F)  SpO2: 98%  Weight: (!) 114.8 kg (253 lb)  Height: 160 cm ('5\' 3"'$ )   Body mass index is 44.82 kg/m.  Physical Exam   GENERAL APPEARANCE Comfortable, no acute issues Development: normal Gross deformities: none  SKIN Rash, lesions, ulcers: none Induration, erythema: none Nodules: none palpable  EYES Conjunctiva and lids: normal Pupils: equal and reactive  EARS, NOSE, MOUTH, THROAT External ears: no lesion or deformity External nose: no lesion or deformity Hearing: grossly normal  NECK Symmetric: yes Trachea: midline Thyroid: Right  thyroid lobe is normal to palpation. Left thyroid lobe may have a soft approximately 2 cm nodule in the  mid to lower portion of the thyroid lobe. There is no associated lymphadenopathy. There is no tenderness.  CHEST Respiratory effort: normal Retraction or accessory muscle use: no Breath sounds: normal bilaterally Rales, rhonchi, wheeze: none  CARDIOVASCULAR Auscultation: regular rhythm, normal rate Murmurs: none Pulses: radial pulse 2+ palpable Lower extremity edema: none  ABDOMEN Not assessed  GENITOURINARY/RECTAL Not assessed  MUSCULOSKELETAL Station and gait: normal Digits and nails: no clubbing or cyanosis Muscle strength: grossly normal all extremities Range of motion: grossly normal all extremities Deformity: none  LYMPHATIC Cervical: none palpable Supraclavicular: none palpable  PSYCHIATRIC Oriented to person, place, and time: yes Mood and affect: normal for situation Judgment and insight: appropriate for situation   Assessment and Plan:   Primary hyperparathyroidism (CMS-HCC)  Patient is referred by her primary care physician for surgical evaluation and management of newly diagnosed primary hyperparathyroidism.  Patient provided with a copy of "Parathyroid Surgery: Treatment for Your Parathyroid Gland Problem", published by Krames, 12 pages. Book reviewed and explained to patient during visit today.  Today we reviewed her clinical history. We reviewed her nuclear medicine parathyroid scan and her laboratory studies. We discussed proceeding with parathyroidectomy. She appears to have a right inferior parathyroid adenoma. We discussed the risk and benefits of surgery including the size and location of the surgical incision, the risk of recurrent laryngeal nerve injury, and the risk of need for additional surgery in the event of multi gland disease. Prior to any surgery I would like to obtain an ultrasound examination of the neck, hopefully to confirm the location of the adenoma and to rule out any significant thyroid disease. If there are any worrisome thyroid  nodules, we will obtain fine-needle aspiration biopsies prior to surgery.  Patient understands and agrees to have her ultrasound examination. We will contact her with those results and make plans for further management at that time.   Armandina Gemma, MD [facility] [place] practice Office: [phone]

## 2022-11-14 NOTE — Progress Notes (Addendum)
COVID Vaccine received:  [x]$  No []$  Yes Date of any COVID positive Test in last 90 days:  none  PCP - Madison Hickman, MD Cardiologist - none  Chest x-ray -  EKG -  05-02-2014   will repeat at PST Stress Test -  ECHO -  Cardiac Cath -   PCR screen: []$  Ordered & Completed                      []$   No Order but Needs PROFEND                      [x]$   N/A for this surgery  Surgery Plan:  [x]$  Ambulatory                            []$  Outpatient in bed                            []$  Admit  Anesthesia:    [x]$  General  []$  Spinal                           []$   Choice []$   MAC  Pacemaker / ICD device [x]$  No []$  Yes        Device order form faxed [x]$  No    []$   Yes      Faxed to:  Spinal Cord Stimulator:[x]$  No []$  Yes      (Remind patient to bring remote DOS) Other Implants:   History of Sleep Apnea? [x]$  No []$  Yes   CPAP used?- [x]$  No []$  Yes    Does the patient monitor blood sugar? []$  No []$  Yes  [x]$  N/A  Blood Thinner / Instructions:  none Aspirin Instructions:  none  ERAS Protocol Ordered: []$  No  [x]$  Yes PRE-SURGERY []$  ENSURE  []$  G2  [x]$  No Drink Ordered Patient is to be NPO after: 07:00 am  Activity level: Patient can t climb a flight of stairs without difficulty; [x]$  No CP but would have SOB.  Patient can perform ADLs without assistance.   Anesthesia review: asthma, HTN, migraines, GERD, Hx of TIA (2009), PONV  Patient denies shortness of breath, fever, cough and chest pain at PAT appointment.  Patient verbalized understanding and agreement to the Pre-Surgical Instructions that were given to them at this PAT appointment. Patient was also educated of the need to review these PAT instructions again prior to his/her surgery.I reviewed the appropriate phone numbers to call if they have any and questions or concerns.

## 2022-11-14 NOTE — Patient Instructions (Addendum)
SURGICAL WAITING ROOM VISITATION Patients having surgery or a procedure may have no more than 2 support people in the waiting area - these visitors may rotate in the visitor waiting room.   Due to an increase in RSV and influenza rates and associated hospitalizations, children ages 51 and under may not visit patients in [place]. If the patient needs to stay at the hospital during part of their recovery, the visitor guidelines for inpatient rooms apply.  PRE-OP VISITATION  Pre-op nurse will coordinate an appropriate time for 1 support person to accompany the patient in pre-op.  This support person may not rotate.  This visitor will be contacted when the time is appropriate for the visitor to come back in the pre-op area.  Please refer to the [facility] website for the visitor guidelines for Inpatients (after your surgery is over and you are in a regular room).  You are not required to quarantine at this time prior to your surgery. However, you must do this: Hand Hygiene often Do NOT share personal items Notify your provider if you are in close contact with someone who has COVID or you develop fever 100.4 or greater, new onset of sneezing, cough, sore throat, shortness of breath or body aches.  If you test positive for Covid or have been in contact with anyone that has tested positive in the last 10 days please notify you surgeon.    Your procedure is scheduled [id number]   November 18, 2022    Report to [facility] Main Entrance: [name] entrance where the [employer] is available.   Report to admitting at:  07:45  AM  +++++Call this number if you have any questions or problems the morning of surgery [phone]  Do not eat food after Midnight the night prior to your surgery/procedure.  After Midnight you may have the following liquids until   07:00  AM DAY OF SURGERY  Clear Liquid Diet Water Black Coffee (sugar ok, NO MILK/CREAM OR CREAMERS)  Tea (sugar ok,  NO MILK/CREAM OR CREAMERS) regular and decaf                             Plain Jell-O  with no fruit (NO RED)                                           Fruit ices (not with fruit pulp, NO RED)                                     Popsicles (NO RED)                                                                  Juice: apple, WHITE grape, WHITE cranberry Sports drinks like Gatorade or Powerade (NO RED)                FOLLOW  ANY ADDITIONAL PRE OP INSTRUCTIONS YOU RECEIVED FROM YOUR SURGEON'S OFFICE!!!   Oral Hygiene is also important to reduce your risk of  infection.        Remember - BRUSH YOUR TEETH THE MORNING OF SURGERY WITH YOUR REGULAR TOOTHPASTE  Do NOT smoke after Midnight the night before surgery.  Take ONLY these medicines the morning of surgery with A SIP OF WATER: Cetirizine (Zyrtec) and you may use your Albuterol inhaler / nebulizer, Flonase nasal spray                   You may not have any metal on your body including hair pins, jewelry, and body piercing  Do not wear make-up, lotions, powders, perfumes or deodorant  Do not wear nail polish including gel and S&S, artificial / acrylic nails, or any other type of covering on natural nails including finger and toenails. If you have artificial nails, gel coating, etc., that needs to be removed by a nail salon, Please have this removed prior to surgery. Not doing so may mean that your surgery could be cancelled or delayed if the Surgeon or anesthesia staff feels like they are unable to monitor you safely.   Do not shave 48 hours prior to surgery to avoid nicks in your skin which may contribute to postoperative infections.   Contacts, Hearing Aids, dentures or bridgework may not be worn into surgery. DENTURES WILL BE REMOVED PRIOR TO SURGERY PLEASE DO NOT APPLY "Poly grip" OR ADHESIVES!!!  Patients discharged on the day of surgery will not be allowed to drive home.  Someone NEEDS to stay with you for the first 24 hours after  anesthesia.  Do not bring your home medications to the hospital. The Pharmacy will dispense medications listed on your medication list to you during your admission in the Hospital.  Please read over the following fact sheets you were given: IF YOU HAVE QUESTIONS ABOUT YOUR PRE-OP INSTRUCTIONS, PLEASE CALL [phone]  ([place])   [facility] - Preparing for Surgery Before surgery, you can play an important role.  Because skin is not sterile, your skin needs to be as free of germs as possible.  You can reduce the number of germs on your skin by washing with CHG (chlorahexidine gluconate) soap before surgery.  CHG is an antiseptic cleaner which kills germs and bonds with the skin to continue killing germs even after washing. Please DO NOT use if you have an allergy to CHG or antibacterial soaps.  If your skin becomes reddened/irritated stop using the CHG and inform your nurse when you arrive at Short Stay. Do not shave (including legs and underarms) for at least 48 hours prior to the first CHG shower.  You may shave your face/neck.  Please follow these instructions carefully:  1.  Shower with CHG Soap the night before surgery and the  morning of surgery.  2.  If you choose to wash your hair, wash your hair first as usual with your normal  shampoo.  3.  After you shampoo, rinse your hair and body thoroughly to remove the shampoo.                             4.  Use CHG as you would any other liquid soap.  You can apply chg directly to the skin and wash.  Gently with a scrungie or clean washcloth.  5.  Apply the CHG Soap to your body ONLY FROM THE NECK DOWN.   Do not use on face/ open  Wound or open sores. Avoid contact with eyes, ears mouth and genitals (private parts).                       Wash face,  Genitals (private parts) with your normal soap.             6.  Wash thoroughly, paying special attention to the area where your  surgery  will be performed.  7.  Thoroughly  rinse your body with warm water from the neck down.  8.  DO NOT shower/wash with your normal soap after using and rinsing off the CHG Soap.            9.  Pat yourself dry with a clean towel.            10.  Wear clean pajamas.            11.  Place clean sheets on your bed the night of your first shower and do not  sleep with pets.  ON THE DAY OF SURGERY : Do not apply any lotions/deodorants the morning of surgery.  Please wear clean clothes to the hospital/surgery center.    FAILURE TO FOLLOW THESE INSTRUCTIONS MAY RESULT IN THE CANCELLATION OF YOUR SURGERY  PATIENT SIGNATURE_________________________________  NURSE SIGNATURE__________________________________  ________________________________________________________________________

## 2022-11-15 ENCOUNTER — Encounter (HOSPITAL_COMMUNITY): Payer: Self-pay | Admitting: *Deleted

## 2022-11-15 ENCOUNTER — Other Ambulatory Visit: Payer: Self-pay

## 2022-11-15 ENCOUNTER — Encounter (HOSPITAL_COMMUNITY)
Admission: RE | Admit: 2022-11-15 | Discharge: 2022-11-15 | Disposition: A | Discharge status: Home / Self Care | Payer: Managed Care, Other (non HMO) | Source: Ambulatory Visit | Attending: Surgery | Admitting: Surgery

## 2022-11-15 VITALS — BP 139/82 | HR 84 | Temp 99.1°F | Resp 20 | Ht 63.0 in | Wt 250.0 lb

## 2022-11-15 DIAGNOSIS — I498 Other specified cardiac arrhythmias: Secondary | ICD-10-CM | POA: Diagnosis not present

## 2022-11-15 DIAGNOSIS — I10 Essential (primary) hypertension: Secondary | ICD-10-CM | POA: Diagnosis not present

## 2022-11-15 DIAGNOSIS — Z01818 Encounter for other preprocedural examination: Secondary | ICD-10-CM | POA: Insufficient documentation

## 2022-11-15 HISTORY — DX: Personal history of urinary calculi: Z87.442

## 2022-11-15 HISTORY — DX: Malignant (primary) neoplasm, unspecified: C80.1

## 2022-11-15 HISTORY — DX: Pneumonia, unspecified organism: J18.9

## 2022-11-15 LAB — CBC
HCT: 44.8 % (ref 36.0–46.0)
Hemoglobin: 14.8 g/dL (ref 12.0–15.0)
MCH: 30.3 pg (ref 26.0–34.0)
MCHC: 33 g/dL (ref 30.0–36.0)
MCV: 91.8 fL (ref 80.0–100.0)
Platelets: 250 10*3/uL (ref 150–400)
RBC: 4.88 MIL/uL (ref 3.87–5.11)
RDW: 12.2 % (ref 11.5–15.5)
WBC: 8.8 10*3/uL (ref 4.0–10.5)
nRBC: 0 % (ref 0.0–0.2)

## 2022-11-15 LAB — BASIC METABOLIC PANEL
Anion gap: 8 (ref 5–15)
BUN: 18 mg/dL (ref 6–20)
CO2: 22 mmol/L (ref 22–32)
Calcium: 11.7 mg/dL — ABNORMAL HIGH (ref 8.9–10.3)
Chloride: 105 mmol/L (ref 98–111)
Creatinine, Ser: 0.61 mg/dL (ref 0.44–1.00)
GFR, Estimated: >60 mL/min (ref >60)
Glucose, Bld: 102 mg/dL — ABNORMAL HIGH (ref 70–99)
Potassium: 4.8 mmol/L (ref 3.5–5.1)
Sodium: 135 mmol/L (ref 135–145)

## 2022-11-17 NOTE — Anesthesia Preprocedure Evaluation (Signed)
Anesthesia Evaluation  Patient identified by MRN, date of birth, ID band Patient awake    Reviewed: Allergy & Precautions, NPO status , Patient's Chart, lab work & pertinent test results  History of Anesthesia Complications (+) PONV, Family history of anesthesia reaction and history of anesthetic complications  Airway Mallampati: III  TM Distance: <3 FB Neck ROM: Full    Dental  (+) Dental Advisory Given,    Pulmonary neg shortness of breath, asthma , neg sleep apnea, neg COPD, neg recent URI, former smoker   Pulmonary exam normal breath sounds clear to auscultation       Cardiovascular hypertension (furosemide), (-) angina (-) Past MI, (-) Cardiac Stents and (-) CABG (-) dysrhythmias  Rhythm:Regular Rate:Normal  Chronic venous insufficiency   Neuro/Psych  Headaches, Seizures - (as a reaction to Dilaudid),  PSYCHIATRIC DISORDERS Anxiety        GI/Hepatic Neg liver ROS,GERD  ,,  Endo/Other  neg diabetes  Morbid obesityPrimary hyperparathyroidism  Renal/GU negative Renal ROS     Musculoskeletal  (+) Arthritis ,    Abdominal  (+) + obese  Peds  Hematology negative hematology ROS (+)   Anesthesia Other Findings   Reproductive/Obstetrics                             Anesthesia Physical Anesthesia Plan  ASA: 3  Anesthesia Plan: General   Post-op Pain Management: Tylenol PO (pre-op)*   Induction: Intravenous  PONV Risk Score and Plan: 4 or greater and Ondansetron, Dexamethasone, Treatment may vary due to age or medical condition, Scopolamine patch - Pre-op and Midazolam  Airway Management Planned: Oral ETT  Additional Equipment:   Intra-op Plan:   Post-operative Plan: Extubation in OR  Informed Consent: I have reviewed the patients History and Physical, chart, labs and discussed the procedure including the risks, benefits and alternatives for the proposed anesthesia with the patient or  authorized representative who has indicated his/her understanding and acceptance.     Dental advisory given  Plan Discussed with: Anesthesiologist and CRNA  Anesthesia Plan Comments: (Risks of general anesthesia discussed including, but not limited to, sore throat, hoarse voice, chipped/damaged teeth, injury to vocal cords, nausea and vomiting, allergic reactions, lung infection, heart attack, stroke, and death. All questions answered. )        Anesthesia Quick Evaluation

## 2022-11-18 ENCOUNTER — Ambulatory Visit (HOSPITAL_BASED_OUTPATIENT_CLINIC_OR_DEPARTMENT_OTHER): Payer: Managed Care, Other (non HMO) | Admitting: Anesthesiology

## 2022-11-18 ENCOUNTER — Encounter (HOSPITAL_COMMUNITY)
Admission: RE | Disposition: A | Discharge status: Home / Self Care | Payer: Self-pay | Source: Ambulatory Visit | Attending: Surgery

## 2022-11-18 ENCOUNTER — Other Ambulatory Visit: Payer: Self-pay

## 2022-11-18 ENCOUNTER — Ambulatory Visit (HOSPITAL_COMMUNITY)
Admission: RE | Admit: 2022-11-18 | Discharge: 2022-11-18 | Disposition: A | Discharge status: Home / Self Care | Payer: Managed Care, Other (non HMO) | Source: Ambulatory Visit | Attending: Surgery | Admitting: Surgery

## 2022-11-18 ENCOUNTER — Encounter (HOSPITAL_COMMUNITY): Payer: Self-pay | Admitting: Surgery

## 2022-11-18 ENCOUNTER — Ambulatory Visit (HOSPITAL_COMMUNITY): Payer: Managed Care, Other (non HMO) | Admitting: Physician Assistant

## 2022-11-18 DIAGNOSIS — Z808 Family history of malignant neoplasm of other organs or systems: Secondary | ICD-10-CM | POA: Diagnosis not present

## 2022-11-18 DIAGNOSIS — Z09 Encounter for follow-up examination after completed treatment for conditions other than malignant neoplasm: Secondary | ICD-10-CM | POA: Diagnosis not present

## 2022-11-18 DIAGNOSIS — K219 Gastro-esophageal reflux disease without esophagitis: Secondary | ICD-10-CM | POA: Insufficient documentation

## 2022-11-18 DIAGNOSIS — E210 Primary hyperparathyroidism: Secondary | ICD-10-CM | POA: Diagnosis present

## 2022-11-18 DIAGNOSIS — Z6841 Body Mass Index (BMI) 40.0 and over, adult: Secondary | ICD-10-CM | POA: Insufficient documentation

## 2022-11-18 DIAGNOSIS — E041 Nontoxic single thyroid nodule: Secondary | ICD-10-CM | POA: Diagnosis present

## 2022-11-18 DIAGNOSIS — J45909 Unspecified asthma, uncomplicated: Secondary | ICD-10-CM

## 2022-11-18 DIAGNOSIS — I872 Venous insufficiency (chronic) (peripheral): Secondary | ICD-10-CM | POA: Insufficient documentation

## 2022-11-18 DIAGNOSIS — Z87442 Personal history of urinary calculi: Secondary | ICD-10-CM | POA: Diagnosis not present

## 2022-11-18 DIAGNOSIS — Z87891 Personal history of nicotine dependence: Secondary | ICD-10-CM | POA: Diagnosis not present

## 2022-11-18 DIAGNOSIS — I10 Essential (primary) hypertension: Secondary | ICD-10-CM | POA: Diagnosis not present

## 2022-11-18 DIAGNOSIS — D351 Benign neoplasm of parathyroid gland: Secondary | ICD-10-CM | POA: Insufficient documentation

## 2022-11-18 DIAGNOSIS — Z79899 Other long term (current) drug therapy: Secondary | ICD-10-CM | POA: Diagnosis not present

## 2022-11-18 HISTORY — PX: PARATHYROIDECTOMY: SHX19

## 2022-11-18 SURGERY — PARATHYROIDECTOMY
Anesthesia: General | Laterality: Right

## 2022-11-18 MED ORDER — FENTANYL CITRATE (PF) 100 MCG/2ML IJ SOLN
INTRAMUSCULAR | Status: AC
  Filled 2022-11-18: qty 2

## 2022-11-18 MED ORDER — CHLORHEXIDINE GLUCONATE CLOTH 2 % EX PADS: 6.0000 | MEDICATED_PAD | Freq: Once | CUTANEOUS | Status: DC

## 2022-11-18 MED ORDER — OXYCODONE HCL 5 MG PO TABS
ORAL_TABLET | ORAL | Status: AC
  Administered 2022-11-18: 5 mg via ORAL
  Filled 2022-11-18: qty 1

## 2022-11-18 MED ORDER — FENTANYL CITRATE (PF) 100 MCG/2ML IJ SOLN
INTRAMUSCULAR | Status: DC | PRN
  Administered 2022-11-18: 100 ug via INTRAVENOUS
  Administered 2022-11-18 (×2): 25 ug via INTRAVENOUS

## 2022-11-18 MED ORDER — CIPROFLOXACIN IN D5W 400 MG/200ML IV SOLN
400.0000 mg | INTRAVENOUS | Status: AC
  Administered 2022-11-18: 400 mg via INTRAVENOUS
  Filled 2022-11-18: qty 200

## 2022-11-18 MED ORDER — LIDOCAINE 2% (20 MG/ML) 5 ML SYRINGE: INTRAMUSCULAR | Status: DC | PRN

## 2022-11-18 MED ORDER — OXYCODONE HCL 5 MG/5ML PO SOLN: 5.0000 mg | Freq: Once | ORAL | Status: AC | PRN

## 2022-11-18 MED ORDER — FENTANYL CITRATE PF 50 MCG/ML IJ SOSY: 25.0000 ug | PREFILLED_SYRINGE | INTRAMUSCULAR | Status: DC | PRN

## 2022-11-18 MED ORDER — CHLORHEXIDINE GLUCONATE 0.12 % MT SOLN
15.0000 mL | Freq: Once | OROMUCOSAL | Status: AC
  Administered 2022-11-18: 15 mL via OROMUCOSAL

## 2022-11-18 MED ORDER — OXYCODONE HCL 5 MG PO TABS: 5.0000 mg | ORAL_TABLET | Freq: Once | ORAL | Status: AC | PRN

## 2022-11-18 MED ORDER — MIDAZOLAM HCL 5 MG/5ML IJ SOLN
INTRAMUSCULAR | Status: DC | PRN
  Administered 2022-11-18: 2 mg via INTRAVENOUS

## 2022-11-18 MED ORDER — PHENYLEPHRINE 80 MCG/ML (10ML) SYRINGE FOR IV PUSH (FOR BLOOD PRESSURE SUPPORT)
PREFILLED_SYRINGE | INTRAVENOUS | Status: AC
  Filled 2022-11-18: qty 10

## 2022-11-18 MED ORDER — PHENYLEPHRINE HCL (PRESSORS) 10 MG/ML IV SOLN
INTRAVENOUS | Status: AC
  Filled 2022-11-18: qty 1

## 2022-11-18 MED ORDER — 0.9 % SODIUM CHLORIDE (POUR BTL) OPTIME
TOPICAL | Status: DC | PRN
  Administered 2022-11-18: 1000 mL

## 2022-11-18 MED ORDER — DEXAMETHASONE SODIUM PHOSPHATE 10 MG/ML IJ SOLN
INTRAMUSCULAR | Status: DC | PRN
  Administered 2022-11-18: 10 mg via INTRAVENOUS

## 2022-11-18 MED ORDER — SCOPOLAMINE 1 MG/3DAYS TD PT72
1.0000 | MEDICATED_PATCH | Freq: Once | TRANSDERMAL | Status: DC
  Administered 2022-11-18: 1.5 mg via TRANSDERMAL
  Filled 2022-11-18: qty 1

## 2022-11-18 MED ORDER — PROPOFOL 10 MG/ML IV BOLUS
INTRAVENOUS | Status: DC | PRN
  Administered 2022-11-18: 200 mg via INTRAVENOUS

## 2022-11-18 MED ORDER — ORAL CARE MOUTH RINSE: 15.0000 mL | Freq: Once | OROMUCOSAL | Status: AC

## 2022-11-18 MED ORDER — AMISULPRIDE (ANTIEMETIC) 5 MG/2ML IV SOLN
INTRAVENOUS | Status: AC
  Filled 2022-11-18: qty 4

## 2022-11-18 MED ORDER — LIDOCAINE 2% (20 MG/ML) 5 ML SYRINGE
INTRAMUSCULAR | Status: DC | PRN
  Administered 2022-11-18: 100 mg via INTRAVENOUS

## 2022-11-18 MED ORDER — ONDANSETRON HCL 4 MG/2ML IJ SOLN
INTRAMUSCULAR | Status: DC | PRN
  Administered 2022-11-18: 4 mg via INTRAVENOUS

## 2022-11-18 MED ORDER — PROMETHAZINE HCL 25 MG/ML IJ SOLN: 6.2500 mg | INTRAMUSCULAR | Status: DC | PRN

## 2022-11-18 MED ORDER — ROCURONIUM BROMIDE 10 MG/ML (PF) SYRINGE
PREFILLED_SYRINGE | INTRAVENOUS | Status: DC | PRN
  Administered 2022-11-18: 60 mg via INTRAVENOUS

## 2022-11-18 MED ORDER — LACTATED RINGERS IV SOLN: INTRAVENOUS | Status: DC

## 2022-11-18 MED ORDER — FENTANYL CITRATE PF 50 MCG/ML IJ SOSY
PREFILLED_SYRINGE | INTRAMUSCULAR | Status: AC
  Administered 2022-11-18: 50 ug via INTRAVENOUS
  Filled 2022-11-18: qty 1

## 2022-11-18 MED ORDER — HEMOSTATIC AGENTS (NO CHARGE) OPTIME
TOPICAL | Status: DC | PRN
  Administered 2022-11-18: 1 via TOPICAL

## 2022-11-18 MED ORDER — BUPIVACAINE HCL (PF) 0.25 % IJ SOLN
INTRAMUSCULAR | Status: AC
  Filled 2022-11-18: qty 30

## 2022-11-18 MED ORDER — MIDAZOLAM HCL 2 MG/2ML IJ SOLN
INTRAMUSCULAR | Status: AC
  Filled 2022-11-18: qty 2

## 2022-11-18 MED ORDER — TRAMADOL HCL 50 MG PO TABS: 50.0000 mg | ORAL_TABLET | Freq: Four times a day (QID) | ORAL | 0 refills | Status: DC | PRN

## 2022-11-18 MED ORDER — AMISULPRIDE (ANTIEMETIC) 5 MG/2ML IV SOLN
10.0000 mg | Freq: Once | INTRAVENOUS | Status: AC | PRN
  Administered 2022-11-18: 10 mg via INTRAVENOUS

## 2022-11-18 MED ORDER — ACETAMINOPHEN 500 MG PO TABS
1000.0000 mg | ORAL_TABLET | Freq: Once | ORAL | Status: AC
  Administered 2022-11-18: 1000 mg via ORAL
  Filled 2022-11-18: qty 2

## 2022-11-18 MED ORDER — BUPIVACAINE HCL 0.25 % IJ SOLN
INTRAMUSCULAR | Status: DC | PRN
  Administered 2022-11-18: 8 mL

## 2022-11-18 MED ORDER — PHENYLEPHRINE 80 MCG/ML (10ML) SYRINGE FOR IV PUSH (FOR BLOOD PRESSURE SUPPORT)
PREFILLED_SYRINGE | INTRAVENOUS | Status: DC | PRN
  Administered 2022-11-18 (×2): 80 ug via INTRAVENOUS

## 2022-11-18 SURGICAL SUPPLY — 37 items
ADH SKN CLS APL DERMABOND .7 (GAUZE/BANDAGES/DRESSINGS) ×1
APL PRP STRL LF DISP 70% ISPRP (MISCELLANEOUS) ×1
ATTRACTOMAT 16X20 MAGNETIC DRP (DRAPES) ×2 IMPLANT
BAG COUNTER SPONGE SURGICOUNT (BAG) ×2 IMPLANT
BAG SPNG CNTER NS LX DISP (BAG) ×1
BLADE SURG 15 STRL LF DISP TIS (BLADE) ×2 IMPLANT
BLADE SURG 15 STRL SS (BLADE) ×1
CHLORAPREP W/TINT 26 (MISCELLANEOUS) ×2 IMPLANT
CLIP TI MEDIUM 6 (CLIP) ×4 IMPLANT
CLIP TI WIDE RED SMALL 6 (CLIP) ×4 IMPLANT
COVER SURGICAL LIGHT HANDLE (MISCELLANEOUS) ×2 IMPLANT
DERMABOND ADVANCED .7 DNX12 (GAUZE/BANDAGES/DRESSINGS) ×2 IMPLANT
DRAPE LAPAROTOMY T 98X78 PEDS (DRAPES) ×2 IMPLANT
DRAPE UTILITY XL STRL (DRAPES) ×2 IMPLANT
ELECT REM PT RETURN 15FT ADLT (MISCELLANEOUS) ×2 IMPLANT
GAUZE 4X4 16PLY ~~LOC~~+RFID DBL (SPONGE) ×2 IMPLANT
GLOVE SURG ORTHO 8.0 STRL STRW (GLOVE) ×2 IMPLANT
GOWN STRL REUS W/ TWL XL LVL3 (GOWN DISPOSABLE) ×6 IMPLANT
GOWN STRL REUS W/TWL XL LVL3 (GOWN DISPOSABLE) ×2
HEMOSTAT SURGICEL 2X4 FIBR (HEMOSTASIS) ×2 IMPLANT
ILLUMINATOR WAVEGUIDE N/F (MISCELLANEOUS) IMPLANT
KIT BASIN OR (CUSTOM PROCEDURE TRAY) ×2 IMPLANT
KIT TURNOVER KIT A (KITS) IMPLANT
NDL HYPO 25X1 1.5 SAFETY (NEEDLE) ×2 IMPLANT
NEEDLE HYPO 25X1 1.5 SAFETY (NEEDLE) ×1 IMPLANT
PACK BASIC VI WITH GOWN DISP (CUSTOM PROCEDURE TRAY) ×2 IMPLANT
PENCIL SMOKE EVACUATOR (MISCELLANEOUS) ×2 IMPLANT
SHEARS HARMONIC 9CM CVD (BLADE) IMPLANT
SUT MNCRL AB 4-0 PS2 18 (SUTURE) ×2 IMPLANT
SUT SILK 3 0 (SUTURE) ×1
SUT SILK 3-0 18XBRD TIE 12 (SUTURE) IMPLANT
SUT VIC AB 3-0 SH 18 (SUTURE) ×2 IMPLANT
SYR BULB IRRIG 60ML STRL (SYRINGE) ×2 IMPLANT
SYR CONTROL 10ML LL (SYRINGE) ×2 IMPLANT
TOWEL OR 17X26 10 PK STRL BLUE (TOWEL DISPOSABLE) ×2 IMPLANT
TOWEL OR NON WOVEN STRL DISP B (DISPOSABLE) ×2 IMPLANT
TUBING CONNECTING 10 (TUBING) ×2 IMPLANT

## 2022-11-18 NOTE — Transfer of Care (Signed)
Immediate Anesthesia Transfer of Care Note  Patient: [name]  Procedure(s) Performed: RIGHT INFERIOR PARATHYROIDECTOMY (Right)  Patient Location: PACU  Anesthesia Type:General  Level of Consciousness: awake, drowsy, and patient cooperative  Airway & Oxygen Therapy: Patient Spontanous Breathing and Patient connected to face mask oxygen  Post-op Assessment: Report given to RN and Post -op Vital signs reviewed and stable  Post vital signs: Reviewed and stable  Last Vitals:  Vitals Value Taken Time  BP 147/117 11/18/22 1138  Temp    Pulse 85 11/18/22 1139  Resp 16   SpO2 100 % 11/18/22 1139  Vitals shown include unvalidated device data.  Last Pain:  Vitals:   11/18/22 0819  TempSrc: Oral         Complications: No notable events documented.

## 2022-11-18 NOTE — Anesthesia Postprocedure Evaluation (Signed)
Anesthesia Post Note  Patient: [name]  Procedure(s) Performed: RIGHT INFERIOR PARATHYROIDECTOMY (Right)     Patient location during evaluation: PACU Anesthesia Type: General Level of consciousness: awake Pain management: pain level controlled Vital Signs Assessment: post-procedure vital signs reviewed and stable Respiratory status: spontaneous breathing, nonlabored ventilation and respiratory function stable Cardiovascular status: blood pressure returned to baseline and stable Postop Assessment: no apparent nausea or vomiting Anesthetic complications: no   No notable events documented.  Last Vitals:  Vitals:   11/18/22 1145 11/18/22 1222  BP:  123/72  Pulse:  84  Resp: 20   Temp:    SpO2:  94%    Last Pain:  Vitals:   11/18/22 1222  TempSrc:   PainSc: 3                  Nilda Simmer

## 2022-11-18 NOTE — Op Note (Signed)
OPERATIVE REPORT - PARATHYROIDECTOMY  Preoperative diagnosis: Primary hyperparathyroidism  Postop diagnosis: Same  Procedure: Right minimally invasive parathyroidectomy  Surgeon:  Armandina Gemma, MD  Assistant:  Malachi Pro, PA-C  Anesthesia: General endotracheal  Estimated blood loss: Minimal  Preparation: ChloraPrep  Indications: Patient is referred by Dr. Madison Hickman for surgical evaluation and management of newly diagnosed primary hyperparathyroidism. Patient was noted on routine laboratory studies to have an elevated serum calcium level of 12.2. Repeat laboratories 3 months later showed a calcium level of 12.8 and an intact PTH level of 154. Patient underwent nuclear medicine parathyroid scan with sestamibi on September 23, 2022. This was positive for a right inferior parathyroid adenoma. Patient was referred to surgery. Patient has no prior history of parathyroid or thyroid disease. There is a family history of thyroid cancer in the patient's mother who was treated with surgery and radioactive iodine and has done well. Patient has had no prior head or neck surgery. 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 Patient notes significant fatigue. She complains of bone and joint pain. She has a history of nephrolithiasis.   Procedure: The patient was prepared in the pre-operative holding area. The patient was brought to the operating room and placed in a supine position on the operating room table. Following administration of general anesthesia, the patient was positioned and then prepped and draped in the usual strict aseptic fashion. After ascertaining that an adequate level of anesthesia been achieved, a neck incision was made with a #15 blade. Dissection was carried through subcutaneous tissues and platysma. Hemostasis was obtained with the electrocautery. Skin flaps were developed circumferentially and a Weitlander retractor was placed for exposure.  Strap muscles were  incised in the midline. Strap muscles were reflected laterally exposing the thyroid lobe. With gentle blunt dissection the thyroid lobe was mobilized.  Dissection was carried posteriorly and an enlarged parathyroid gland was identified. It was gently mobilized. Vascular structures were divided between small ligaclips. Care was taken to avoid the recurrent laryngeal nerve. The parathyroid gland was completely excised. It was submitted to pathology where frozen section confirmed hypercellular parathyroid tissue consistent with adenoma.  Neck was irrigated with warm saline and good hemostasis was noted. Fibrillar was placed in the operative field. Strap muscles were approximated in the midline with interrupted 3-0 Vicryl sutures. Platysma was closed with interrupted 3-0 Vicryl sutures. Marcaine was infiltrated circumferentially. Skin was closed with a running 4-0 Monocryl subcuticular suture. Wound was washed and dried and Dermabond was applied. Patient was awakened from anesthesia and brought to the recovery room. The patient tolerated the procedure well.   Armandina Gemma, [facility] Office: [phone]

## 2022-11-18 NOTE — Anesthesia Procedure Notes (Signed)
Procedure Name: Intubation Date/Time: 11/18/2022 10:26 AM  Performed by: Pilar Grammes, CRNAPre-anesthesia Checklist: Patient identified, Emergency Drugs available, Suction available, Patient being monitored and Timeout performed Patient Re-evaluated:Patient Re-evaluated prior to induction Oxygen Delivery Method: Circle system utilized Preoxygenation: Pre-oxygenation with 100% oxygen Induction Type: IV induction Ventilation: Mask ventilation without difficulty Laryngoscope Size: Miller and 2 Grade View: Grade II Tube type: Oral Tube size: 7.5 mm Number of attempts: 1 Airway Equipment and Method: Stylet Placement Confirmation: positive ETCO2, ETT inserted through vocal cords under direct vision, CO2 detector and breath sounds checked- equal and bilateral Secured at: 21 cm Tube secured with: Tape Dental Injury: Teeth and Oropharynx as per pre-operative assessment

## 2022-11-18 NOTE — Interval H&P Note (Signed)
History and Physical Interval Note:  11/18/2022 9:44 AM  [name]  has presented today for surgery, with the diagnosis of PRIMARY HYPERPARATHYROIDISM.  The various methods of treatment have been discussed with the patient and family. After consideration of risks, benefits and other options for treatment, the patient has consented to    Procedure(s): RIGHT INFERIOR PARATHYROIDECTOMY (Right) as a surgical intervention.    The patient's history has been reviewed, patient examined, no change in status, stable for surgery.  I have reviewed the patient's chart and labs.  Questions were answered to the patient's satisfaction.    Armandina Gemma, [facility] Office: [place]

## 2022-11-18 NOTE — Discharge Instructions (Addendum)
[facility] - Dr. Todd Gerkin  THYROID & PARATHYROID SURGERY:  POST-OP INSTRUCTIONS  Always review the instruction sheet provided by the hospital nurse at discharge.  A prescription for pain medication may be sent to your pharmacy at the time of discharge.  Take your pain medication as prescribed.  If narcotic pain medicine is not needed, then you may take acetaminophen (Tylenol) or ibuprofen (Advil) as needed for pain or soreness.  Take your normal home medications as prescribed unless otherwise directed.  If you need a refill on your pain medication, please contact the office during regular business hours.  Prescriptions will not be processed by the office after 5:00PM or on weekends.  Start with a light diet upon arrival home, such as soup and crackers or toast.  Be sure to drink plenty of fluids.  Resume your normal diet the day after surgery.  Most patients will experience some swelling and bruising on the chest and neck area.  Ice packs will help for the first 48 hours after arriving home.  Swelling and bruising will take several days to resolve.   It is common to experience some constipation after surgery.  Increasing fluid intake and taking a stool softener (Colace) will usually help to prevent this problem.  A mild laxative (Milk of Magnesia or Miralax) should be taken according to package directions if there has been no bowel movement after 48 hours.  Dermabond glue covers your incision. This seals the wound and you may shower at any time. The Dermabond will remain in place for about a week.  You may gradually remove the glue when it loosens around the edges.  If you need to loosen the Dermabond for removal, apply a layer of Vaseline to the wound for 15 minutes and then remove with a Kleenex. Your sutures are under the skin and will not show - they will dissolve on their own.  You may resume light daily activities beginning the day after discharge (such as self-care,  walking, climbing stairs), gradually increasing activities as tolerated. You may have sexual intercourse when it is comfortable. Refrain from any heavy lifting or straining until approved by your doctor. You may drive when you no longer are taking prescription pain medication, you can comfortably wear a seatbelt, and you can safely maneuver your car and apply the brakes.  You will see your doctor in the office for a follow-up appointment approximately three weeks after your surgery.  Make sure that you call for this appointment within a day or two after you arrive home to insure a convenient appointment time. Please have any requested laboratory tests performed a few days prior to your office visit so that the results will be available at your follow up appointment.  WHEN TO CALL THE [facility] OFFICE: -- Fever greater than 101.5 -- Inability to urinate -- Nausea and/or vomiting - persistent -- Extreme swelling or bruising -- Continued bleeding from incision -- Increased pain, redness, or drainage from the incision -- Difficulty swallowing or breathing -- Muscle cramping or spasms -- Numbness or tingling in hands or around lips  The clinic staff is available to answer your questions during regular business hours.  Please don't hesitate to call and ask to speak to one of the nurses if you have concerns.  [facility] OFFICE: [phone] (24 hours)  Please sign up for MyChart accounts. This will allow you to communicate directly with my nurse or myself without having to call the office. It will also allow you   to view your test results. You will need to enroll in MyChart for my office ([facility]) and for the hospital ([facility]).  Todd Gerkin, MD [facility] 

## 2022-11-19 ENCOUNTER — Encounter (HOSPITAL_COMMUNITY): Payer: Self-pay | Admitting: Surgery

## 2022-11-19 LAB — SURGICAL PATHOLOGY

## 2022-11-28 ENCOUNTER — Ambulatory Visit
Admission: RE | Admit: 2022-11-28 | Discharge: 2022-11-28 | Disposition: A | Discharge status: Home / Self Care | Payer: Managed Care, Other (non HMO) | Source: Ambulatory Visit | Attending: Family Medicine | Admitting: Family Medicine

## 2022-11-28 DIAGNOSIS — E210 Primary hyperparathyroidism: Secondary | ICD-10-CM

## 2022-12-09 ENCOUNTER — Telehealth: Payer: Self-pay

## 2022-12-09 NOTE — Telephone Encounter (Signed)
Patient calls nurse line reporting a sore throat since Friday.   She reports she is very hoarse and has not been able to eat or drink without "getting choked up." She denies any fevers, congestion or cough.   She had a parathyroidectomy on 2/12. She is unsure if she has a virus or if this is something related to the surgery.   She reports she does have a post op with her surgeon tomorrow at [place]. However, she would like to be seen by [place] to rule out any infection.   Patient scheduled for tomorrow.

## 2022-12-10 ENCOUNTER — Ambulatory Visit (INDEPENDENT_AMBULATORY_CARE_PROVIDER_SITE_OTHER): Payer: Managed Care, Other (non HMO) | Admitting: Family Medicine

## 2022-12-10 ENCOUNTER — Encounter: Payer: Self-pay | Admitting: Family Medicine

## 2022-12-10 VITALS — BP 127/91 | HR 77 | Ht 63.0 in | Wt 253.6 lb

## 2022-12-10 DIAGNOSIS — E892 Postprocedural hypoparathyroidism: Secondary | ICD-10-CM | POA: Diagnosis not present

## 2022-12-10 DIAGNOSIS — J029 Acute pharyngitis, unspecified: Secondary | ICD-10-CM | POA: Diagnosis not present

## 2022-12-10 DIAGNOSIS — R1310 Dysphagia, unspecified: Secondary | ICD-10-CM | POA: Diagnosis not present

## 2022-12-10 DIAGNOSIS — Z9089 Acquired absence of other organs: Secondary | ICD-10-CM | POA: Insufficient documentation

## 2022-12-10 NOTE — Progress Notes (Signed)
    SUBJECTIVE:   CHIEF COMPLAINT / HPI:   Sore Throat - Had parathyroidectomy on 2/12 - Since 3/1, has been having sore throat, hoarseness, and difficulty with oral intake - Has post-op visit with surgeon today - Having difficulty with her ability to cough  - Has history of asthma and used albuterol inhaler yesterday and this morning without much improvement   PERTINENT  PMH / PSH: GERD, hyperparathyroidism s/p parathyroidectomy, morbid obesity  OBJECTIVE:   LMP 10/10/2021 Comment: once or twice in past year  General: NAD, well-appearing, well-nourished Respiratory: No respiratory distress, breathing comfortably, able to speak in full sentences Skin: warm and dry, no rashes noted on exposed skin Psych: Appropriate affect and mood HEENT: No tonsils present, no exudates in oropharynx, no pharyngeal erythema, no obvious cervical lymph apathy on palpation, right TM occluded with wax, left TM normal  ASSESSMENT/PLAN:   Sore throat with dysphagia In the setting of recent parathyroidectomy on 2/12.  Patient reassuringly has not had any fevers or other systemic symptoms but given the hoarseness and dysphagia she is experiencing currently, there would be concern for possible abscess or other complication.  Differential does also include a viral URI, but given the recent surgery patient is more high risk and we will further evaluate. - CT soft tissue neck with contrast, scheduled for 3/6 - Ensure follow-up with surgeon, if they do not agree with pursuing imaging patient is to call and we can cancel the imaging - ER precautions for significant dysphagia discussed - Monitor for further viral URI symptoms   Kaili Castille, [place]

## 2022-12-10 NOTE — Patient Instructions (Signed)
We have ordered a CT scan for your neck to make sure there is no signs of an abscess or other concerning.  Please make sure to mention your concerns to your surgeon today and see if they agree with getting further imaging or if they are able to evaluate it themselves.  If they do not want the imaging, please call and let our office know and we can cancel it.

## 2022-12-11 ENCOUNTER — Encounter (HOSPITAL_COMMUNITY): Payer: Self-pay

## 2022-12-11 ENCOUNTER — Ambulatory Visit (HOSPITAL_COMMUNITY): Payer: Managed Care, Other (non HMO)

## 2023-01-23 DIAGNOSIS — R499 Unspecified voice and resonance disorder: Secondary | ICD-10-CM | POA: Insufficient documentation

## 2023-02-27 ENCOUNTER — Encounter: Payer: Managed Care, Other (non HMO) | Admitting: Family Medicine

## 2023-03-11 ENCOUNTER — Encounter: Payer: Self-pay | Admitting: Surgery

## 2023-03-20 ENCOUNTER — Telehealth: Payer: Self-pay

## 2023-03-20 ENCOUNTER — Encounter: Payer: Self-pay | Admitting: Family Medicine

## 2023-03-20 ENCOUNTER — Ambulatory Visit (INDEPENDENT_AMBULATORY_CARE_PROVIDER_SITE_OTHER): Payer: Managed Care, Other (non HMO) | Admitting: Family Medicine

## 2023-03-20 VITALS — BP 126/83 | HR 84 | Ht 63.0 in | Wt 253.1 lb

## 2023-03-20 DIAGNOSIS — E210 Primary hyperparathyroidism: Secondary | ICD-10-CM | POA: Diagnosis not present

## 2023-03-20 DIAGNOSIS — Z9089 Acquired absence of other organs: Secondary | ICD-10-CM

## 2023-03-20 DIAGNOSIS — E041 Nontoxic single thyroid nodule: Secondary | ICD-10-CM

## 2023-03-20 DIAGNOSIS — T63444S Toxic effect of venom of bees, undetermined, sequela: Secondary | ICD-10-CM

## 2023-03-20 DIAGNOSIS — I872 Venous insufficiency (chronic) (peripheral): Secondary | ICD-10-CM

## 2023-03-20 DIAGNOSIS — K219 Gastro-esophageal reflux disease without esophagitis: Secondary | ICD-10-CM

## 2023-03-20 DIAGNOSIS — J0100 Acute maxillary sinusitis, unspecified: Secondary | ICD-10-CM

## 2023-03-20 DIAGNOSIS — J4541 Moderate persistent asthma with (acute) exacerbation: Secondary | ICD-10-CM

## 2023-03-20 DIAGNOSIS — Z9889 Other specified postprocedural states: Secondary | ICD-10-CM | POA: Diagnosis not present

## 2023-03-20 DIAGNOSIS — Z13220 Encounter for screening for lipoid disorders: Secondary | ICD-10-CM

## 2023-03-20 DIAGNOSIS — J4540 Moderate persistent asthma, uncomplicated: Secondary | ICD-10-CM

## 2023-03-20 HISTORY — DX: Acute maxillary sinusitis, unspecified: J01.00

## 2023-03-20 MED ORDER — ALBUTEROL SULFATE 1.25 MG/3ML IN NEBU: 1.0000 | INHALATION_SOLUTION | Freq: Four times a day (QID) | RESPIRATORY_TRACT | 0 refills | Status: AC | PRN

## 2023-03-20 MED ORDER — PREDNISONE 20 MG PO TABS: 40.0000 mg | ORAL_TABLET | Freq: Every day | ORAL | 0 refills | Status: AC

## 2023-03-20 MED ORDER — CEFADROXIL 500 MG PO CAPS: 500.0000 mg | ORAL_CAPSULE | Freq: Two times a day (BID) | ORAL | 0 refills | Status: AC

## 2023-03-20 MED ORDER — EPINEPHRINE 0.3 MG/0.3ML IJ SOAJ: 0.3000 mg | Freq: Once | INTRAMUSCULAR | 1 refills | Status: DC | PRN

## 2023-03-20 NOTE — Assessment & Plan Note (Signed)
Established problem worsened.  Coughing with sputum for one week Shortness of breath after coughing Using Albuterol meter dosed inhaler and Advair 50/250 bid Cor: RRR Lung: bilateral scattered wheezing, no Acc mm use   Start: Prednisone 40 mg daily x 5 days          Refill Albuterol neb solution  Continue Advair BID

## 2023-03-20 NOTE — Assessment & Plan Note (Signed)
New problem Facial pressure, PND ? (+) cough Duration illness one week Prescription Duricef 500 mg twice a day x 5days Continue Flonase

## 2023-03-20 NOTE — Assessment & Plan Note (Signed)
Established problem. Stable. Patient is at goal of minimal ankle edema No signs of complications, medication side effects, or red flags. Continue current medications and other regiments.

## 2023-03-20 NOTE — Assessment & Plan Note (Signed)
Established problem. Stable. Patient is at goal of GER symptom control. No signs of complications, medication side effects, or red flags. Continue current medications and other regiments.

## 2023-03-20 NOTE — Assessment & Plan Note (Signed)
01/23/23 most recent serum calcium 9.1 Checking Comprehensive Metabolic Panel to monitor

## 2023-03-20 NOTE — Assessment & Plan Note (Signed)
Established problem. Stable. Patient is at goal of no anaphylactic attacks. EpiPen expired. New EpiPen prescribed

## 2023-03-20 NOTE — Telephone Encounter (Signed)
Patient calls nurse line in regards to today's apt with PCP.   She reports she forgot to ask him for a prescription for cough medicine.   She reports she has taken several OTC syrups with no relief.   Will forward to PCP.

## 2023-03-20 NOTE — Progress Notes (Signed)
[name] is alone Sources of clinical information for visit is/are patient, past medical records, and Nursing assessment for this office visit was reviewed with the patient for accuracy and revision.     Previous Report(s) Reviewed: Dr Ardine Eng most recent office visit note..     03/20/2023   11:26 AM  Depression screen PHQ 2/9  Decreased Interest 0  Down, Depressed, Hopeless 0  PHQ - 2 Score 0  Altered sleeping 3  Tired, decreased energy 3  Change in appetite 1  Feeling bad or failure about yourself  0  Trouble concentrating 1  Moving slowly or fidgety/restless 0  Suicidal thoughts 0  PHQ-9 Score 8  Difficult doing work/chores Not difficult at all   [facility] Office Visit from 03/20/2023 in [facility] Office Visit from 12/10/2022 in [facility] Office Visit from 09/09/2022 in [facility]  Thoughts that you would be better off dead, or of hurting yourself in some way Not at all Not at all Not at all  PHQ-9 Total Score 8 7 8           12/02/2017    3:58 PM 11/01/2015    3:22 PM 11/01/2015    3:18 PM  Fall Risk   Falls in the past year? No No No  Risk for fall due to :  Other (Comment)        03/20/2023   11:26 AM 12/10/2022    8:36 AM 09/09/2022    8:51 AM  PHQ9 SCORE ONLY  PHQ-9 Total Score 8 7 8     There are no preventive care reminders to display for this patient.  Health Maintenance Due  Topic Date Due   Colonoscopy  Never done      History/P.E. limitations: none  There are no preventive care reminders to display for this patient. There are no preventive care reminders to display for this patient.  Health Maintenance Due  Topic Date Due   Colonoscopy  Never done     Chief Complaint  Patient presents with   Annual Exam     --------------------------------------------------------------------------------------------------------------------------------------------- Visit Problem  List with A/P  Status post parathyroidectomy 01/23/23 most recent serum calcium 9.1 Checking Comprehensive Metabolic Panel to monitor   Bee sting-induced anaphylaxis Established problem. Stable. Patient is at goal of no anaphylactic attacks. EpiPen expired. New EpiPen prescribed   Chronic GERD Established problem. Stable. Patient is at goal of GER symptom control. No signs of complications, medication side effects, or red flags. Continue current medications and other regiments.   Persistent asthma with acute exacerbation Established problem worsened.  Coughing with sputum for one week Shortness of breath after coughing Using Albuterol meter dosed inhaler and Advair 50/250 bid Cor: RRR Lung: bilateral scattered wheezing, no Acc mm use   Start: Prednisone 40 mg daily x 5 days          Refill Albuterol neb solution  Continue Advair BID   Chronic venous insufficiency Established problem. Stable. Patient is at goal of minimal ankle edema No signs of complications, medication side effects, or red flags. Continue current medications and other regiments.   Acute maxillary sinusitis New problem Facial pressure, PND ? (+) cough Duration illness one week Prescription Duricef 500 mg twice a day x 5days Continue Flonase  CPT E&M Office Visit Time Before Visit; reviewing medical records (e.g. recent visits, labs, studies): 10 minutes During Visit (F2F time): 15 minutes After Visit (discussion with family or HCP,  prescribing, ordering, referring, calling result/recommendations or documenting on same day): 5 minutes Total Visit Time: 30 minutes

## 2023-03-20 NOTE — Patient Instructions (Addendum)
Please take the Prednisone 20 mg tablet, two tablets daily for 5 days.  Please take the Duricef (cefadroxil) antibiotic, 500 mg tablet, one tablet twice a day for 5 days.   Refills on your Albuterol solution and EpiPen were sent to pharmacy.  We are checking your calcium, thyroid and cholesterol today.

## 2023-03-21 LAB — CMP14+EGFR
ALT: 27 IU/L (ref 0–32)
AST: 29 IU/L (ref 0–40)
Albumin/Globulin Ratio: 2
Albumin: 4.5 g/dL (ref 3.9–4.9)
Alkaline Phosphatase: 207 IU/L — ABNORMAL HIGH (ref 44–121)
BUN/Creatinine Ratio: 18 (ref 9–23)
BUN: 12 mg/dL (ref 6–24)
Bilirubin Total: 0.4 mg/dL (ref 0.0–1.2)
CO2: 24 mmol/L (ref 20–29)
Calcium: 9.2 mg/dL (ref 8.7–10.2)
Chloride: 105 mmol/L (ref 96–106)
Creatinine, Ser: 0.68 mg/dL (ref 0.57–1.00)
Globulin, Total: 2.3 g/dL (ref 1.5–4.5)
Glucose: 76 mg/dL (ref 70–99)
Potassium: 4.4 mmol/L (ref 3.5–5.2)
Sodium: 142 mmol/L (ref 134–144)
Total Protein: 6.8 g/dL (ref 6.0–8.5)
eGFR: 109 mL/min/{1.73_m2} (ref >59)

## 2023-03-21 LAB — LIPID PANEL
Chol/HDL Ratio: 2.9 ratio (ref 0.0–4.4)
Cholesterol, Total: 142 mg/dL (ref 100–199)
HDL: 49 mg/dL (ref >39)
LDL Chol Calc (NIH): 77 mg/dL (ref 0–99)
Triglycerides: 81 mg/dL (ref 0–149)
VLDL Cholesterol Cal: 16 mg/dL (ref 5–40)

## 2023-03-21 LAB — TSH: TSH: 1.27 u[IU]/mL (ref 0.450–4.500)

## 2023-03-21 MED ORDER — BENZONATATE 100 MG PO CAPS: 100.0000 mg | ORAL_CAPSULE | Freq: Three times a day (TID) | ORAL | 0 refills | Status: DC | PRN

## 2023-03-21 NOTE — Telephone Encounter (Signed)
Informed patient that the medication was sent to the pharmacy. °

## 2023-03-21 NOTE — Telephone Encounter (Signed)
Cough medication, Tessalon Pearls, sent to pharmacy.

## 2023-04-01 ENCOUNTER — Other Ambulatory Visit: Payer: Self-pay

## 2023-04-01 DIAGNOSIS — J4540 Moderate persistent asthma, uncomplicated: Secondary | ICD-10-CM

## 2023-04-01 MED ORDER — ADVAIR DISKUS 100-50 MCG/ACT IN AEPB: 1.0000 | INHALATION_SPRAY | Freq: Two times a day (BID) | RESPIRATORY_TRACT | 12 refills | Status: DC

## 2023-04-02 ENCOUNTER — Telehealth: Payer: Self-pay

## 2023-04-02 MED ORDER — MUPIROCIN 2 % EX OINT: 1.0000 | TOPICAL_OINTMENT | Freq: Two times a day (BID) | CUTANEOUS | 0 refills | Status: DC

## 2023-04-04 ENCOUNTER — Telehealth: Payer: Self-pay

## 2023-04-04 ENCOUNTER — Other Ambulatory Visit (HOSPITAL_COMMUNITY): Payer: Self-pay

## 2023-04-04 NOTE — Telephone Encounter (Signed)
A Prior Authorization was initiated for this patients ADVAIR through CoverMyMeds.   Key: [id number]

## 2023-04-09 NOTE — Telephone Encounter (Signed)
Sent request to PCP. Etter Royall, CMA  

## 2023-04-18 ENCOUNTER — Other Ambulatory Visit (HOSPITAL_COMMUNITY): Payer: Self-pay

## 2023-04-18 NOTE — Telephone Encounter (Signed)
Cancelled previous PA, pt no longer under that insurance. Will resubmit under new one.

## 2023-04-21 NOTE — Telephone Encounter (Signed)
A Prior Authorization was initiated for this patients ADVAIR DISKUS through CoverMyMeds.   Key: [id number]

## 2023-04-23 ENCOUNTER — Other Ambulatory Visit (HOSPITAL_COMMUNITY): Payer: Self-pay

## 2023-04-23 NOTE — Telephone Encounter (Signed)
Prior Auth for patients medication ADVAIR DISKUS denied by [employer] via CoverMyMeds.   Reason:   Generic copay shows copay $35. Called pharmacy to fill medication for generic. Pharmacy ordering medication.

## 2023-04-29 ENCOUNTER — Other Ambulatory Visit: Payer: Self-pay

## 2023-04-29 DIAGNOSIS — I872 Venous insufficiency (chronic) (peripheral): Secondary | ICD-10-CM

## 2023-05-01 MED ORDER — FUROSEMIDE 20 MG PO TABS: 20.0000 mg | ORAL_TABLET | Freq: Every day | ORAL | 3 refills | Status: DC

## 2023-05-02 ENCOUNTER — Telehealth: Payer: Self-pay

## 2023-05-02 DIAGNOSIS — J4541 Moderate persistent asthma with (acute) exacerbation: Secondary | ICD-10-CM

## 2023-05-02 MED ORDER — FLUTICASONE-SALMETEROL 100-50 MCG/ACT IN AEPB: 1.0000 | INHALATION_SPRAY | Freq: Two times a day (BID) | RESPIRATORY_TRACT | 5 refills | Status: AC

## 2023-05-02 NOTE — Telephone Encounter (Signed)
Patient calls nurse line in regards to Advair prescription.   Pharmacy did a PA and this was denied on 7/17.  Please send in the generic Fluticasone-Salmeterol.   Will forward to ordering provider.

## 2023-05-28 ENCOUNTER — Other Ambulatory Visit: Payer: Self-pay

## 2023-05-28 DIAGNOSIS — K219 Gastro-esophageal reflux disease without esophagitis: Secondary | ICD-10-CM

## 2023-05-28 MED ORDER — OMEPRAZOLE 40 MG PO CPDR: DELAYED_RELEASE_CAPSULE | ORAL | 3 refills | Status: DC

## 2023-06-22 ENCOUNTER — Telehealth: Payer: BC Managed Care – PPO | Admitting: Nurse Practitioner

## 2023-06-22 ENCOUNTER — Encounter: Payer: Self-pay | Admitting: Nurse Practitioner

## 2023-06-22 DIAGNOSIS — B9689 Other specified bacterial agents as the cause of diseases classified elsewhere: Secondary | ICD-10-CM | POA: Diagnosis not present

## 2023-06-22 DIAGNOSIS — J329 Chronic sinusitis, unspecified: Secondary | ICD-10-CM

## 2023-06-22 MED ORDER — DOXYCYCLINE HYCLATE 100 MG PO TABS: 100.0000 mg | ORAL_TABLET | Freq: Two times a day (BID) | ORAL | 0 refills | Status: AC

## 2023-06-22 NOTE — Progress Notes (Signed)
I have spent 5 minutes in review of e-visit questionnaire, review and updating patient chart, medical decision making and response to patient.  ° °Jerrell Mangel W Secilia Apps, NP ° °  °

## 2023-06-22 NOTE — Progress Notes (Signed)

## 2023-09-15 ENCOUNTER — Other Ambulatory Visit: Payer: Self-pay | Admitting: Family Medicine

## 2023-09-15 DIAGNOSIS — I872 Venous insufficiency (chronic) (peripheral): Secondary | ICD-10-CM

## 2023-11-02 ENCOUNTER — Ambulatory Visit: Payer: BC Managed Care – PPO

## 2023-12-12 ENCOUNTER — Encounter: Payer: BC Managed Care – PPO | Admitting: Family Medicine

## 2023-12-19 ENCOUNTER — Encounter: Payer: Self-pay | Admitting: Family Medicine

## 2023-12-19 ENCOUNTER — Ambulatory Visit (INDEPENDENT_AMBULATORY_CARE_PROVIDER_SITE_OTHER): Admitting: Family Medicine

## 2023-12-19 VITALS — BP 126/80 | HR 86 | Wt 263.2 lb

## 2023-12-19 DIAGNOSIS — J4520 Mild intermittent asthma, uncomplicated: Secondary | ICD-10-CM

## 2023-12-19 DIAGNOSIS — T782XXS Anaphylactic shock, unspecified, sequela: Secondary | ICD-10-CM

## 2023-12-19 DIAGNOSIS — Z9089 Acquired absence of other organs: Secondary | ICD-10-CM

## 2023-12-19 DIAGNOSIS — N951 Menopausal and female climacteric states: Secondary | ICD-10-CM

## 2023-12-19 DIAGNOSIS — Z1211 Encounter for screening for malignant neoplasm of colon: Secondary | ICD-10-CM

## 2023-12-19 DIAGNOSIS — Z9889 Other specified postprocedural states: Secondary | ICD-10-CM

## 2023-12-19 DIAGNOSIS — I872 Venous insufficiency (chronic) (peripheral): Secondary | ICD-10-CM

## 2023-12-19 DIAGNOSIS — E041 Nontoxic single thyroid nodule: Secondary | ICD-10-CM | POA: Diagnosis not present

## 2023-12-19 LAB — POCT GLYCOSYLATED HEMOGLOBIN (HGB A1C): Hemoglobin A1C: 5.1 % (ref 4.0–5.6)

## 2023-12-19 MED ORDER — ALBUTEROL SULFATE HFA 108 (90 BASE) MCG/ACT IN AERS: 2.0000 | INHALATION_SPRAY | Freq: Four times a day (QID) | RESPIRATORY_TRACT | 1 refills | Status: AC | PRN

## 2023-12-19 MED ORDER — EPINEPHRINE 0.3 MG/0.3ML IJ SOAJ: 0.3000 mg | Freq: Once | INTRAMUSCULAR | 1 refills | Status: DC | PRN

## 2023-12-19 NOTE — Assessment & Plan Note (Signed)
 Discussed compression, elevation. Lasix keeps her from peeing all through the night, ok to continue.

## 2023-12-19 NOTE — Assessment & Plan Note (Signed)
Doing well on current regimen, no changes made today. 

## 2023-12-19 NOTE — Assessment & Plan Note (Signed)
 Obtain repeat US for monitoring. Obtain TSH

## 2023-12-19 NOTE — Patient Instructions (Addendum)
 It was great to see you!  Our plans for today:  - We are getting an ultrasound of your thyroid. - We sent refills of your medications.  - Come back in 1 year or sooner if needed.   We are checking some labs today, we will release these results to your MyChart.  Take care and seek immediate care sooner if you develop any concerns.   Dr. Linwood Dibbles

## 2023-12-19 NOTE — Progress Notes (Signed)
 BP 126/80   Pulse 86   Wt 263 lb 3.2 oz (119.4 kg)   LMP 10/10/2021 Comment: once or twice in past year  SpO2 96%   BMI 46.62 kg/m    Subjective:    Patient ID: [name], female    DOB: 1976/06/11, 48 y.o.   MRN: [id number]  HPI: [name] is a 48 y.o. female presenting on 12/19/2023 for comprehensive medical examination. Current medical complaints include:none  Asthma, Allergies - Medications: albuterol PRN, advair, flonase, zyrtec - Taking: albuterol prn recently due to allergies. - Common triggers: pollen - ED visits/hospitalization in the last 6 months: none - Current symptoms: none  OBESITY - Meds: none - Complications of obesity: knee pain, GERD - Peak weight: 263lb - Current weight: 263lb - Diet: doesn't eat breakfast. Lunch 1-2pm. Light dinner around 6:30pm. Mostly water. Eats lots of fruits and vegetables. Some carbs. - Exercise: walking - previously been to [facility] clinic.  - has FH of thyroid cancer, unsure what type.   GERD - on prilosec. Doing well.  She currently lives with: husband Menopausal Symptoms: yes. LMP last year.  Depression Screen done today and results listed below:     03/20/2023   11:26 AM 12/10/2022    8:36 AM 09/09/2022    8:51 AM 07/01/2022   11:36 AM 01/24/2022    8:38 AM  Depression screen PHQ 2/9  Decreased Interest 0 0 0 -- 0  Down, Depressed, Hopeless 0 0 0  0  PHQ - 2 Score 0 0 0  0  Altered sleeping 3 3 3  3   Tired, decreased energy 3 2 2  2   Change in appetite 1 1 1  1   Feeling bad or failure about yourself  0 0 0  0  Trouble concentrating 1 1 2   0  Moving slowly or fidgety/restless 0 0 0  0  Suicidal thoughts 0 0 0  0  PHQ-9 Score 8 7 8  6   Difficult doing work/chores Not difficult at all  Somewhat difficult     Past medical history, surgical history, medications, allergies, family history and social history reviewed with patient today and changes made to appropriate areas of the chart.      Objective:     BP 126/80   Pulse 86   Wt 263 lb 3.2 oz (119.4 kg)   LMP 10/10/2021 Comment: once or twice in past year  SpO2 96%   BMI 46.62 kg/m   Wt Readings from Last 3 Encounters:  12/19/23 263 lb 3.2 oz (119.4 kg)  03/20/23 253 lb 2 oz (114.8 kg)  12/10/22 253 lb 9.6 oz (115 kg)    Physical Exam Constitutional:      General: She is not in acute distress.    Appearance: She is obese. She is not toxic-appearing.  HENT:     Head: Normocephalic and atraumatic.     Right Ear: External ear normal. There is impacted cerumen.     Left Ear: Tympanic membrane, ear canal and external ear normal.     Nose: Nose normal.     Mouth/Throat:     Mouth: Mucous membranes are moist.     Pharynx: Oropharynx is clear.  Eyes:     Extraocular Movements: Extraocular movements intact.  Cardiovascular:     Rate and Rhythm: Normal rate and regular rhythm.     Heart sounds: Normal heart sounds. No murmur heard. Pulmonary:     Effort: Pulmonary effort is normal. No  respiratory distress.     Breath sounds: Normal breath sounds.  Abdominal:     General: Bowel sounds are normal.     Palpations: Abdomen is soft.     Tenderness: There is no abdominal tenderness.  Musculoskeletal:        General: Normal range of motion.     Cervical back: Normal range of motion.     Right lower leg: No edema.     Left lower leg: No edema.  Lymphadenopathy:     Cervical: No cervical adenopathy.  Skin:    General: Skin is warm and dry.  Neurological:     Mental Status: She is alert and oriented to person, place, and time. Mental status is at baseline.     Gait: Gait normal.  Psychiatric:        Mood and Affect: Mood normal.        Behavior: Behavior normal.         Assessment & Plan:   Problem List Items Addressed This Visit       Cardiovascular and Mediastinum   Chronic venous insufficiency   Discussed compression, elevation. Lasix keeps her from peeing all through the night, ok to continue.       Relevant  Medications   EPINEPHrine 0.3 mg/0.3 mL IJ SOAJ injection     Respiratory   Asthma   Doing well on current regimen, no changes made today.      Relevant Medications   albuterol (VENTOLIN HFA) 108 (90 Base) MCG/ACT inhaler     Endocrine   Right thyroid nodule - Primary   Obtain repeat US for monitoring. Obtain TSH      Relevant Orders   TSH   US THYROID     Other   Morbid obesity (HCC)   Discussed diet and exercise. Avoid GLP-1 given FH of thyroid cancer, unknown which type.      Relevant Orders   HgB A1c (Completed)   Comprehensive metabolic panel   Bee sting-induced anaphylaxis   Relevant Medications   EPINEPHrine 0.3 mg/0.3 mL IJ SOAJ injection   Menopausal symptoms   Status post parathyroidectomy   Repeat CMP. Last DEXA last year normal.      Relevant Orders   TSH   Other Visit Diagnoses       Screening for colon cancer       Relevant Orders   Cologuard        Follow up plan: Return in about 1 year (around 12/18/2024) for CPE.   LABORATORY TESTING:  - Pap smear: up to date  IMMUNIZATIONS:   - Tdap: Tetanus vaccination status reviewed: last tetanus booster within 10 years. - Influenza: Refused - Pneumococcal: Not applicable - HPV: Not applicable - Shingrix vaccine: Not applicable  SCREENING: - Mammogram: Up to date  - Colonoscopy: due  - Bone Density: Up to date  - Lung Cancer Screening: Not applicable   Hep C Screening: UTD STD testing and prevention (HIV/chl/gon/syphilis): low risk Menstrual History/LMP/Abnormal Bleeding: postmenopausal Incontinence Symptoms: none  Osteoporosis: Discussed high calcium and vitamin D supplementation, weight bearing exercises  Advanced Care Planning: A voluntary discussion about advance care planning including the explanation and discussion of advance directives.  Discussed health care proxy and Living will, and the patient was able to identify a health care proxy as husband.  Patient does not have a living  will at present time. If patient does have living will, I have requested they bring this to the clinic to be scanned  in to their chart.  PATIENT COUNSELING:   Advised to take 1 mg of folate supplement per day if capable of pregnancy.   Sexuality: Discussed sexually transmitted diseases, partner selection, use of condoms, avoidance of unintended pregnancy  and contraceptive alternatives.   Advised to avoid cigarette smoking.  I discussed with the patient that most people either abstain from alcohol or drink within safe limits (<=14/week and <=4 drinks/occasion for males, <=7/weeks and <= 3 drinks/occasion for females) and that the risk for alcohol disorders and other health effects rises proportionally with the number of drinks per week and how often a drinker exceeds daily limits.  Discussed cessation/primary prevention of drug use and availability of treatment for abuse.   Diet: Encouraged to adjust caloric intake to maintain  or achieve ideal body weight, to reduce intake of dietary saturated fat and total fat, to limit sodium intake by avoiding high sodium foods and not adding table salt, and to maintain adequate dietary potassium and calcium preferably from fresh fruits, vegetables, and low-fat dairy products.    stressed the importance of regular exercise  Injury prevention: Discussed safety belts, safety helmets, smoke detector, smoking near bedding or upholstery.   Dental health: Discussed importance of regular tooth brushing, flossing, and dental visits.    NEXT PREVENTATIVE PHYSICAL DUE IN 1 YEAR. Return in about 1 year (around 12/18/2024) for CPE.

## 2023-12-19 NOTE — Assessment & Plan Note (Signed)
 Discussed diet and exercise. Avoid GLP-1 given FH of thyroid cancer, unknown which type.

## 2023-12-19 NOTE — Assessment & Plan Note (Signed)
 Repeat CMP. Last DEXA last year normal.

## 2023-12-20 LAB — COMPREHENSIVE METABOLIC PANEL
ALT: 24 IU/L (ref 0–32)
AST: 24 IU/L (ref 0–40)
Albumin: 4.3 g/dL (ref 3.9–4.9)
Alkaline Phosphatase: 152 IU/L — ABNORMAL HIGH (ref 44–121)
BUN/Creatinine Ratio: 17 (ref 9–23)
BUN: 12 mg/dL (ref 6–24)
Bilirubin Total: 0.3 mg/dL (ref 0.0–1.2)
CO2: 25 mmol/L (ref 20–29)
Calcium: 9.4 mg/dL (ref 8.7–10.2)
Chloride: 104 mmol/L (ref 96–106)
Creatinine, Ser: 0.69 mg/dL (ref 0.57–1.00)
Globulin, Total: 2.3 g/dL (ref 1.5–4.5)
Glucose: 86 mg/dL (ref 70–99)
Potassium: 4.9 mmol/L (ref 3.5–5.2)
Sodium: 142 mmol/L (ref 134–144)
Total Protein: 6.6 g/dL (ref 6.0–8.5)
eGFR: 108 mL/min/{1.73_m2} (ref >59)

## 2023-12-20 LAB — TSH: TSH: 0.907 u[IU]/mL (ref 0.450–4.500)

## 2023-12-22 ENCOUNTER — Encounter: Payer: Self-pay | Admitting: Family Medicine

## 2023-12-23 ENCOUNTER — Other Ambulatory Visit

## 2023-12-26 ENCOUNTER — Ambulatory Visit
Admission: RE | Admit: 2023-12-26 | Discharge: 2023-12-26 | Disposition: A | Discharge status: Home / Self Care | Source: Ambulatory Visit | Attending: Family Medicine | Admitting: Family Medicine

## 2023-12-26 DIAGNOSIS — E041 Nontoxic single thyroid nodule: Secondary | ICD-10-CM | POA: Diagnosis not present

## 2023-12-28 ENCOUNTER — Encounter: Payer: Self-pay | Admitting: Family Medicine

## 2024-01-01 DIAGNOSIS — Z1211 Encounter for screening for malignant neoplasm of colon: Secondary | ICD-10-CM | POA: Diagnosis not present

## 2024-01-04 LAB — COLOGUARD: COLOGUARD: NEGATIVE

## 2024-03-28 ENCOUNTER — Other Ambulatory Visit: Payer: Self-pay | Admitting: Family Medicine

## 2024-03-28 DIAGNOSIS — T782XXS Anaphylactic shock, unspecified, sequela: Secondary | ICD-10-CM

## 2024-05-26 ENCOUNTER — Other Ambulatory Visit: Payer: Self-pay | Admitting: Family Medicine

## 2024-05-31 ENCOUNTER — Encounter: Payer: Self-pay | Admitting: Family Medicine

## 2024-05-31 ENCOUNTER — Ambulatory Visit: Admitting: Family Medicine

## 2024-05-31 VITALS — BP 124/80 | HR 81 | Ht 63.0 in | Wt 270.0 lb

## 2024-05-31 DIAGNOSIS — G4700 Insomnia, unspecified: Secondary | ICD-10-CM

## 2024-05-31 DIAGNOSIS — G5623 Lesion of ulnar nerve, bilateral upper limbs: Secondary | ICD-10-CM

## 2024-05-31 DIAGNOSIS — R748 Abnormal levels of other serum enzymes: Secondary | ICD-10-CM | POA: Insufficient documentation

## 2024-05-31 DIAGNOSIS — M2550 Pain in unspecified joint: Secondary | ICD-10-CM | POA: Insufficient documentation

## 2024-05-31 DIAGNOSIS — R0683 Snoring: Secondary | ICD-10-CM

## 2024-05-31 DIAGNOSIS — G5620 Lesion of ulnar nerve, unspecified upper limb: Secondary | ICD-10-CM | POA: Insufficient documentation

## 2024-05-31 LAB — POCT SEDIMENTATION RATE: POCT SED RATE: 21 mm/h (ref 0–22)

## 2024-05-31 MED ORDER — DULOXETINE HCL 30 MG PO CPEP: 30.0000 mg | ORAL_CAPSULE | Freq: Every day | ORAL | 0 refills | Status: DC

## 2024-05-31 MED ORDER — MUPIROCIN 2 % EX OINT: 1.0000 | TOPICAL_OINTMENT | Freq: Two times a day (BID) | CUTANEOUS | 0 refills | Status: AC

## 2024-05-31 MED ORDER — CONTRAVE 8-90 MG PO TB12
ORAL_TABLET | ORAL | 0 refills | Status: DC
Start: 2024-05-31 — End: 2024-06-14

## 2024-05-31 NOTE — Assessment & Plan Note (Signed)
 L>R. Likely exacerbated by typing. Trial Duloxetine  and naproxen . F/u 4 wks.

## 2024-05-31 NOTE — Assessment & Plan Note (Signed)
 With snoring, PND, nocturia. STOP BANG 3. Will refer for sleep study.

## 2024-05-31 NOTE — Progress Notes (Signed)
   SUBJECTIVE:   CHIEF COMPLAINT / HPI:   Discussed the use of AI scribe software for clinical note transcription with the patient, who gave verbal consent to proceed.  History of Present Illness [name] is a 48 year old female who presents with concerns about weight management and joint pain.  OBESITY - Meds: none - Complications of obesity: knee pain, GERD - Peak weight: 270lb - Current weight: 270lb - Diet: doesn't eat breakfast. Lunch 1-2pm. Light dinner around 6:30pm. Mostly water. Eats lots of fruits and vegetables. Some carbs. - Exercise: walking - previously been to [facility] clinic.  - has FH of thyroid  cancer, unsure what type.  - prior A1c normal.  Polyarticular joint pain - Joint pain involves wrists, shoulders, knees, ankles, and hips - Pain worsens in the mornings and evenings - Movement provides slight improvement, but activity exacerbates pain - Takes daily over-the-counter ibuprofen  with symptomatic relief - History of carpal tunnel syndrome s/p release; currently no tingling or numbness, only a need to stretch hands - Left wrist pain with upward movement - Shoulder pain worsens with activities such as washing hair - she is R handed - Mother has fibromyalgia; uncertain if symptoms are related - unsure if any FH of autoimmune disorders. - TSH 12/2023 normal. - currently with b/l shouler and lateral wrist pain. Wrist pain worse with typing. Feels she has to wring hands out. - denies trauma.  Sleep disturbance and suspected sleep apnea - Difficulty sleeping with frequent awakenings and nocturia - Snores and wakes up gasping for air - Over-the-counter sleep medication does not prevent frequent awakenings  Elevated alk phos - needs updated labs.   OBJECTIVE:   BP 124/80   Pulse 81   Ht 5' 3 (1.6 m)   Wt 270 lb (122.5 kg)   LMP 10/10/2021 Comment: once or twice in past year  SpO2 92%   BMI 47.83 kg/m   Gen: well appearing, in NAD Card:  RRR Lungs: CTAB Ext: WWP, no edema MSK: Shoulder:  Inspection: no appreciable joint swelling, erythema Palpation: TTP diffusely ROM: AROM limited 2/2 pain, full PROM in all planes Strength: 5/5 UE strength Stability: no joint laxity Special Tests: negative empty can, Neer, speed Neurovascular: intact  Left Wrist: Inspection: no appreciable joint swelling, erythema, overlying rashes ROM: limited AROM in dorsiflexion due to pain. Pain with full PROM. Strength: 5/5 finger abduction/adduction, grip strength. Stability: no joint laxity Special Tests: +Tinels over [place] canal, negative Tinels over Carpal Tunnel.  Neurovascular: sensation intact.   ASSESSMENT/PLAN:   Morbid obesity (HCC) Trial contrave . Continue with diet and exercise efforts. Avoid GLP-1 agonist given FH of thyroid  cancer, unknown which type. F/u 4 wks.  Insomnia With snoring, PND, nocturia. STOP BANG 3. Will refer for sleep study.  Elevated alkaline phosphatase level Get GGT, vit D.  Polyarthralgia Unclear etiology. Exam largely unremarkable. Will obtain labs to rule out autoimmune etiology. Possible more fibromyalgia picture given concomitant sleep disturbance, fatigue. Trial duloxetine , naproxen . F/u 4 wks.  Ulnar neuropathy L>R. Likely exacerbated by typing. Trial Duloxetine  and naproxen . F/u 4 wks.     Donald CHRISTELLA Lai, DO

## 2024-05-31 NOTE — Assessment & Plan Note (Addendum)
 Trial contrave . Continue with diet and exercise efforts. Avoid GLP-1 agonist given FH of thyroid  cancer, unknown which type. F/u 4 wks.

## 2024-05-31 NOTE — Patient Instructions (Addendum)
 It was great to see you!  Our plans for today:  - We are referring you for sleep study. Let us  know if you don't hear about an appointment in the next few weeks.  - Take the contrave  per package directions.  - Try the duloxetine  for your pain.This can take a few weeks to reach full effect. You can take naproxen  scheduled for the next week until this takes full effect.  We are checking some labs today, we will release these results to your MyChart.  Take care and seek immediate care sooner if you develop any concerns.   Dr. Thuan Tippett

## 2024-05-31 NOTE — Assessment & Plan Note (Signed)
 Get GGT, vit D.

## 2024-05-31 NOTE — Addendum Note (Signed)
 Addended by: Lalitha Ilyas L on: 05/31/2024 10:49 AM   Modules accepted: Orders

## 2024-05-31 NOTE — Assessment & Plan Note (Addendum)
 Unclear etiology. Exam largely unremarkable. Will obtain labs to rule out autoimmune etiology. Possible more fibromyalgia picture given concomitant sleep disturbance, fatigue. Trial duloxetine , naproxen . F/u 4 wks.

## 2024-06-02 ENCOUNTER — Ambulatory Visit: Payer: Self-pay | Admitting: Family Medicine

## 2024-06-02 LAB — CBC
Hematocrit: 43.4 % (ref 34.0–46.6)
Hemoglobin: 14.1 g/dL (ref 11.1–15.9)
MCH: 29.7 pg (ref 26.6–33.0)
MCHC: 32.5 g/dL (ref 31.5–35.7)
MCV: 91 fL (ref 79–97)
Platelets: 266 x10E3/uL (ref 150–450)
RBC: 4.75 x10E6/uL (ref 3.77–5.28)
RDW: 11.9 % (ref 11.7–15.4)
WBC: 8.9 x10E3/uL (ref 3.4–10.8)

## 2024-06-02 LAB — CYCLIC CITRUL PEPTIDE ANTIBODY, IGG/IGA: Cyclic Citrullin Peptide Ab: 2 U (ref 0–19)

## 2024-06-02 LAB — ANA W/REFLEX: Anti Nuclear Antibody (ANA): NEGATIVE

## 2024-06-02 LAB — ALKALINE PHOSPHATASE: Alkaline Phosphatase: 168 IU/L — ABNORMAL HIGH (ref 44–121)

## 2024-06-02 LAB — GAMMA GT: GGT: 20 IU/L (ref 0–60)

## 2024-06-02 LAB — VITAMIN D 25 HYDROXY (VIT D DEFICIENCY, FRACTURES): Vit D, 25-Hydroxy: 12.2 ng/mL — ABNORMAL LOW (ref 30.0–100.0)

## 2024-06-02 LAB — RHEUMATOID FACTOR: Rheumatoid fact SerPl-aCnc: <10 [IU]/mL (ref <14.0)

## 2024-06-03 MED ORDER — AMITRIPTYLINE HCL 10 MG PO TABS: 10.0000 mg | ORAL_TABLET | Freq: Every day | ORAL | 0 refills | Status: DC

## 2024-06-14 ENCOUNTER — Encounter: Payer: Self-pay | Admitting: Family Medicine

## 2024-06-14 MED ORDER — CONTRAVE 8-90 MG PO TB12: ORAL_TABLET | ORAL | 0 refills | Status: DC

## 2024-06-14 NOTE — Telephone Encounter (Signed)
 Called and spoke with pharmacist at [facility]. He verifies that prescription was deleted.  Rx resent to [facility].   Chiquita JAYSON English, RN

## 2024-06-16 ENCOUNTER — Other Ambulatory Visit (HOSPITAL_COMMUNITY): Payer: Self-pay

## 2024-06-16 ENCOUNTER — Telehealth: Payer: Self-pay

## 2024-06-16 NOTE — Telephone Encounter (Signed)
 Patient calls nurse line requesting a PA for Contrave .   Will forward to pharmacy team for assistance.

## 2024-06-17 ENCOUNTER — Other Ambulatory Visit (HOSPITAL_COMMUNITY): Payer: Self-pay

## 2024-06-17 NOTE — Telephone Encounter (Signed)
 Pharmacy Patient Advocate Encounter  Received notification from [facility] that Prior Authorization for CONTRAVE  has been DENIED.  Full denial letter will be uploaded to the media tab. See denial reason below.  This health benefit plan does not cover the following services, supplies, drugs or charges: Any treatment or regimen, medical or surgical, for the purpose of reducing or controlling the weight of the member, or for the treatment of obesity, except for surgical treatment of morbid obesity, or as specifically covered by this health benefit plan.   PA #/Case ID/Reference #: [id number]

## 2024-06-17 NOTE — Telephone Encounter (Signed)
 Prior authorization submitted for CONTRAVE  to [employer] via Latent.   Key: [id number]

## 2024-06-17 NOTE — Telephone Encounter (Signed)
 Spoke with pharmacy team in regards to Contrave .   Patients insurance will not cover any weight loss medications, oral or injectable.   Per pharmacy team, a prior authorization will not be beneficial.   There is a Contrave  savings copay card available that she can enroll in. However, the cost is ~200 per copay.   Will forward to PCP.

## 2024-06-18 ENCOUNTER — Ambulatory Visit: Admitting: Family Medicine

## 2024-06-21 ENCOUNTER — Encounter: Payer: Self-pay | Admitting: Family Medicine

## 2024-06-28 ENCOUNTER — Ambulatory Visit: Admitting: Family Medicine

## 2024-06-30 ENCOUNTER — Other Ambulatory Visit: Payer: Self-pay | Admitting: Family Medicine

## 2024-07-15 ENCOUNTER — Telehealth: Payer: Self-pay

## 2024-07-15 DIAGNOSIS — R0683 Snoring: Secondary | ICD-10-CM

## 2024-07-15 NOTE — Telephone Encounter (Signed)
 Hi Dr Madelon,  [facility] called. Insurance denied in person sleep study but will approve at home sleep study. Please put in an order for an at home sleep study if that is appropriate for this patient.  Thank you, Margit Dimes, CMA

## 2024-07-20 ENCOUNTER — Other Ambulatory Visit: Payer: Self-pay | Admitting: Family Medicine

## 2024-08-01 NOTE — Progress Notes (Unsigned)
   SUBJECTIVE:   CHIEF COMPLAINT / HPI:   Discussed the use of AI scribe software for clinical note transcription with the patient, who gave verbal consent to proceed.  History of Present Illness [name] is a 48 year old female with obesity and fibromyalgia who presents for follow-up regarding medication management and weight concerns.  Obesity and weight management - Difficulty obtaining and tolerating Contrave , with a delay in access and inability to increase the evening dose due to sleep disturbances - has taken contrave  for about 1 month - Gained one pound since last visit - Family history of thyroid  cancer (unknown which type) limits treatment options for obesity - does feel it makes her a bit more jittery and difficulty sleeping but willing to try for another month.  Chronic pain and fibromyalgia symptoms - Persistent pain exacerbated by physical activity - Previous medications for fibromyalgia, including duloxetine  and amitriptyline , discontinued due to adverse effects (nausea)  Fatigue - Daily morning headaches managed with ibuprofen  and Tylenol  - Poor sleep quality, often requiring over-the-counter sleep aids at double the recommended dose - No completed sleep study, previously order home sleep study.  Vitamin d  deficiency, elevated alk phos - Recent laboratory results showed low vitamin D  levels - Not taking vitamin D  supplements  Preventive health maintenance - Last mammogram in 2020   OBJECTIVE:   BP 124/82   Pulse 88   Wt 271 lb 12.8 oz (123.3 kg)   LMP 10/10/2021 Comment: once or twice in past year  SpO2 95%   BMI 48.15 kg/m   Gen: well appearing, in NAD Card: RRR Lungs: CTAB Ext: WWP, no edema  ASSESSMENT/PLAN:   Elevated blood pressure reading in office with diagnosis of hypertension Initially elevated, improved on recheck. No changes.   Morbid obesity (HCC) Discussed risk/benefits of continuing contrave , elects to continue. F/u 2 months.  Continue diet/exercise efforts. Consider zepbound if sleep study indicates OSA.  Polyarthralgia Negative autoimmune w/u. Likely fibromyalgia as previously noted. Unable to tolerate duloxetine , amitriptyline . Discussed regular activity, sleep (awaiting sleep study). Discussed physical therapy, declined.   Insomnia With snoring, PND, nocturia. STOP BANG 3. Awaiting sleep study, message sent to referral coordinator for status check.   Elevated alkaline phosphatase level With low vit D. Recheck levels once on vit D supplementation in a few months.   General Health Maintenance Mammogram last completed in 2023. No recent mammogram in the past year. - Order mammogram.   Donald CHRISTELLA Lai, DO

## 2024-08-02 ENCOUNTER — Ambulatory Visit (INDEPENDENT_AMBULATORY_CARE_PROVIDER_SITE_OTHER): Admitting: Family Medicine

## 2024-08-02 ENCOUNTER — Encounter: Payer: Self-pay | Admitting: Family Medicine

## 2024-08-02 VITALS — BP 124/82 | HR 88 | Wt 271.8 lb

## 2024-08-02 DIAGNOSIS — Z1231 Encounter for screening mammogram for malignant neoplasm of breast: Secondary | ICD-10-CM | POA: Diagnosis not present

## 2024-08-02 DIAGNOSIS — E041 Nontoxic single thyroid nodule: Secondary | ICD-10-CM

## 2024-08-02 DIAGNOSIS — R748 Abnormal levels of other serum enzymes: Secondary | ICD-10-CM

## 2024-08-02 DIAGNOSIS — M2550 Pain in unspecified joint: Secondary | ICD-10-CM

## 2024-08-02 DIAGNOSIS — I10 Essential (primary) hypertension: Secondary | ICD-10-CM | POA: Diagnosis not present

## 2024-08-02 DIAGNOSIS — G4700 Insomnia, unspecified: Secondary | ICD-10-CM

## 2024-08-02 NOTE — Patient Instructions (Signed)
 It was great to see you!  Our plans for today:  - We ordered a screening mammogram for you today. You can call [facility] at [phone] to schedule this.  - Let me know if you need anything else to get contrave  cheaper. - Let me know if you change your mind about physical therapy.  - I will let you know once I hear about your sleep study referral.  - Come back in 2 months.  Take care and seek immediate care sooner if you develop any concerns.   Dr. Sameera Betton

## 2024-08-02 NOTE — Assessment & Plan Note (Signed)
 With snoring, PND, nocturia. STOP BANG 3. Awaiting sleep study, message sent to referral coordinator for status check.

## 2024-08-02 NOTE — Assessment & Plan Note (Signed)
 Initially elevated, improved on recheck. No changes.

## 2024-08-02 NOTE — Assessment & Plan Note (Signed)
 With low vit D. Recheck levels once on vit D supplementation in a few months.

## 2024-08-02 NOTE — Assessment & Plan Note (Signed)
 Negative autoimmune w/u. Likely fibromyalgia as previously noted. Unable to tolerate duloxetine , amitriptyline . Discussed regular activity, sleep (awaiting sleep study). Discussed physical therapy, declined.

## 2024-08-02 NOTE — Assessment & Plan Note (Addendum)
 Discussed risk/benefits of continuing contrave , elects to continue. F/u 2 months. Continue diet/exercise efforts. Consider zepbound if sleep study indicates OSA.

## 2024-08-03 ENCOUNTER — Encounter: Payer: Self-pay | Admitting: Family Medicine

## 2024-08-03 DIAGNOSIS — G4733 Obstructive sleep apnea (adult) (pediatric): Secondary | ICD-10-CM

## 2024-08-17 ENCOUNTER — Ambulatory Visit
Admission: RE | Admit: 2024-08-17 | Discharge: 2024-08-17 | Disposition: A | Discharge status: Home / Self Care | Source: Ambulatory Visit | Attending: Family Medicine | Admitting: Family Medicine

## 2024-08-17 DIAGNOSIS — Z1231 Encounter for screening mammogram for malignant neoplasm of breast: Secondary | ICD-10-CM

## 2024-08-18 ENCOUNTER — Ambulatory Visit (HOSPITAL_BASED_OUTPATIENT_CLINIC_OR_DEPARTMENT_OTHER): Attending: Family Medicine | Admitting: Internal Medicine

## 2024-08-18 DIAGNOSIS — Z6841 Body Mass Index (BMI) 40.0 and over, adult: Secondary | ICD-10-CM | POA: Diagnosis not present

## 2024-08-18 DIAGNOSIS — R0683 Snoring: Secondary | ICD-10-CM | POA: Diagnosis not present

## 2024-08-18 DIAGNOSIS — G4733 Obstructive sleep apnea (adult) (pediatric): Secondary | ICD-10-CM | POA: Diagnosis not present

## 2024-08-23 ENCOUNTER — Ambulatory Visit (HOSPITAL_BASED_OUTPATIENT_CLINIC_OR_DEPARTMENT_OTHER): Admitting: Internal Medicine

## 2024-08-24 ENCOUNTER — Ambulatory Visit

## 2024-08-24 ENCOUNTER — Other Ambulatory Visit: Payer: Self-pay | Admitting: Family Medicine

## 2024-08-25 ENCOUNTER — Other Ambulatory Visit: Payer: Self-pay | Admitting: Family Medicine

## 2024-08-25 DIAGNOSIS — I872 Venous insufficiency (chronic) (peripheral): Secondary | ICD-10-CM

## 2024-08-25 NOTE — Telephone Encounter (Signed)
 Pt did not tolerate, self discontinued

## 2024-08-28 ENCOUNTER — Other Ambulatory Visit: Payer: Self-pay | Admitting: Family Medicine

## 2024-08-28 DIAGNOSIS — K219 Gastro-esophageal reflux disease without esophagitis: Secondary | ICD-10-CM

## 2024-08-28 NOTE — Procedures (Signed)
 [facility] [address] [place], [place] [address] Tel: [phone]   Fax: [phone]  Home Sleep Test Interpretation  Patient Name: [name], [name] Date: 08/18/2024  Date of Birth: 11-24-1975 Study Type: HST  Age: 48 year MRN #: [id number]  Sex: Female Interpreting Physician: NEYSA RAMA, [id number]  Height: 5' 3 Referring Physician: Donald HERO Rumball  Weight: 271.0 lbs Recording Tech: Will Poet RRT RPSGT RST  BMI: 48.0 Scoring Tech: Will Poet RRT RPSGT RST  ESS: 1 Neck Size: 14  %%startinterp%% %%startinterp%% Indications for Polysomnography The patient is a 48 year old Female who is 5' 3 and weighs 271.0 lbs. Her BMI equals 48.0.  A home sleep apnea test was performed to evaluate for -OSA.  Medication  No Data.   Polysomnogram Data A home sleep test recorded the standard physiologic parameters including EKG, nasal and oral airflow.  Respiratory parameters of chest and abdominal movements were recorded with Respiratory Inductance Plethysmography belts.  Oxygen saturation was recorded by pulse oximetry.   Study Architecture The total recording time of the polysomnogram was 365.0 minutes. The total monitoring time was 365.5 minutes.  Time spent in Supine position was 89.0 minutes.   Respiratory Events The study revealed a presence of 73 obstructive, - central, and - mixed apneas resulting in an Apnea index of 12.0 events per hour.  There were 34 hypopneas (>=3% desaturation and/or arousal) resulting in an Apnea\Hypopnea Index (AHI >=3% desaturation and/or arousal) of 17.6 events per hour.  There were 31 hypopneas (>=4% desaturation) resulting in an Apnea\Hypopnea Index (AHI >=4% desaturation) of 17.1 events per hour.  There were - Respiratory Effort Related Arousals resulting in a RERA index of - events per hour. The Respiratory Disturbance Index is 17.6 events per hour.  The snore index was - events per hour.  Mean  oxygen saturation was 94.9%.  The lowest oxygen saturation during monitoring time was 78.0%.  Time spent <=88% oxygen saturation was 4.9 minutes (1.3%).  Cardiac Summary The average pulse rate was 82.9 bpm.  The minimum pulse rate was 41.0 bpm while the maximum pulse rate was 181.0 bpm.    Comments: Moderate obstructive sleep apnea, AHI (3%) 17.6/hr. Snoring with oxygen desaturation to a nadir of 78%, mean 94.9%.  Diagnosis: Obstructive sleep apnea  Recommendations: Suggest autopap 5-15, CPAP titration sleep study or fitted oral appliance.   This study was personally reviewed and electronically signed by: Dr. Rama JONETTA Neysa Accredited Board Certified in Sleep Medicine Date/Time: 08/28/24   1:04    %%endinterp%% %%endinterp%%    Study Overview  Recording Time: 463.3 min. Monitoring Time: 365.5 min.  Analysis Start:  10:13:04 PM Supine Time: 89.0 min.  Analysis Stop:  04:18:03 AM     Study Summary   Count Index Longest Event Duration  Apneas & Hypopneas: 107 17.6  Apneas: 75.6 sec.     Hypopneas: 64.6 sec.  RERAs: - - - sec.  Desaturations: 75 12.3 72.0 sec.  Snores: - - - sec.    Minimum Oxygen Saturation: 78.0%    Respiratory Summary   Total Duration Supine Non-Supine   Count Index Average Longest Count Index Count Index  Obstructive Apnea 73 12.0 18.6 75.6 52 35.1 21 4.6   Mixed Apnea - - - - - - - -   Central Apnea - - - - - - - -   Total Apneas 73 12.0 18.6 75.6 52 35.1 21 4.6  Hypopneas 3% 34 5.6 N.A. N.A. 7 4.7 27 5.9   Apneas & Hyp. 3% 107 17.6 N.A. N.A. 59 39.8 48 10.4            Hypopneas 4% 31 5.1 N.A. N.A. 6 4.0 25 5.4  Apneas & Hyp. 4% 104 17.1 N.A. N.A. 58 39.1 46 10.0             RERAs - - - - - - - -  RDI 109 17.9 N.A. N.A. 59 39.8 50 10.8   Oxygen Saturation Summary   Total Supine Non-Supine  Average SpO2 94.9% 94.4% 95.1%  Minimum SpO2 78.0% 78.0% 86.0%   Maximum SpO2 100.0% 100.0% 100.0%   Oxygen Saturation  Distribution  Range (%) Time in range (min) Time in range (%)  90.0 - 100.0 353.2 97.0%  80.0 - 90.0 10.9 3.0%  70.0 - 80.0 0.2 0.1%  60.0 - 70.0 - -  50.0 - 60.0 - -  0.0 - 50.0 - -  Time Spent <=88% SpO2  Range (%) Time in range (min) Time in range (%)  0.0 - 88.0 4.9 1.3%  Cardiac Summary   Total Supine Non-Supine  Average Pulse Rate (BPM) 82.9 81.2 83.4  Minimum Pulse Rate (BPM) 41.0 64.0 41.0  Maximum Pulse Rate (BPM) 181.0 111.0 181.0                     Technologist Comments  -                         Reggy Neysa Bateman, Biomedical Engineer of Sleep Medicine  ELECTRONICALLY SIGNED ON:  08/28/2024, 12:58 PM [facility] PH: [phone]   FX: [phone] ACCREDITED BY THE [employer]ICAN ACAD[employer] OF SLEEP MEDICINE

## 2024-08-31 MED ORDER — ZEPBOUND 2.5 MG/0.5ML ~~LOC~~ SOAJ
2.5000 mg | SUBCUTANEOUS | 0 refills | Status: AC
Start: 2024-08-31 — End: ?

## 2024-09-07 ENCOUNTER — Other Ambulatory Visit (HOSPITAL_COMMUNITY): Payer: Self-pay

## 2024-09-07 ENCOUNTER — Telehealth: Payer: Self-pay

## 2024-09-07 NOTE — Telephone Encounter (Signed)
 Pharmacy Patient Advocate Encounter   Received notification from Patient Advice Request messages that prior authorization for ZEPBOUND  2.5MG  is required/requested.   Insurance verification completed.   The patient is insured through [facility].   PA required; PA submitted to above mentioned insurance via Latent Key/confirmation #/EOC [id number] Status is pending.

## 2024-09-08 NOTE — Telephone Encounter (Signed)
 Pharmacy Patient Advocate Encounter  Received notification from [facility] that Prior Authorization for zepbound  2.5mg  has been DENIED.  Full denial letter will be uploaded to the media tab. See denial reason below.     This health benefit plan does not cover the following services, supplies, drugs or charges: Any treatment or regimen, medical or surgical, for the purpose of reducing or controlling the weight of the member, or for the treatment of obesity, except for surgical treatment of morbid obesity, or as specifically covered by this health benefit plan.   (Submitted with dx of OSA)  PA #/Case ID/Reference #: [id number]
